# Patient Record
Sex: Male | Born: 1980 | Race: Black or African American | Hispanic: No | Marital: Single | Smoking: Never smoker
Health system: Southern US, Community
[De-identification: ages and names within clinical notes are randomized; demographics above are authoritative.]

## PROBLEM LIST (undated history)

## (undated) DIAGNOSIS — R4701 Aphasia: Secondary | ICD-10-CM

## (undated) DIAGNOSIS — F79 Unspecified intellectual disabilities: Secondary | ICD-10-CM

## (undated) DIAGNOSIS — F419 Anxiety disorder, unspecified: Secondary | ICD-10-CM

## (undated) DIAGNOSIS — F840 Autistic disorder: Secondary | ICD-10-CM

## (undated) DIAGNOSIS — E119 Type 2 diabetes mellitus without complications: Secondary | ICD-10-CM

## (undated) HISTORY — PX: OTHER SURGICAL HISTORY: SHX169

---
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
## 2003-07-12 ENCOUNTER — Encounter: Admission: RE | Admit: 2003-07-12 | Discharge: 2003-07-12 | Payer: Self-pay | Admitting: Family Medicine

## 2005-03-29 ENCOUNTER — Ambulatory Visit: Payer: Self-pay | Admitting: Family Medicine

## 2006-06-25 HISTORY — PX: MULTIPLE TOOTH EXTRACTIONS: SHX2053

## 2006-07-10 ENCOUNTER — Encounter: Admission: RE | Admit: 2006-07-10 | Discharge: 2006-10-08 | Payer: Self-pay | Admitting: Neurology

## 2006-08-22 DIAGNOSIS — E669 Obesity, unspecified: Secondary | ICD-10-CM | POA: Insufficient documentation

## 2006-08-22 DIAGNOSIS — F73 Profound intellectual disabilities: Secondary | ICD-10-CM | POA: Insufficient documentation

## 2007-03-03 ENCOUNTER — Ambulatory Visit (HOSPITAL_COMMUNITY): Admission: RE | Admit: 2007-03-03 | Discharge: 2007-03-03 | Payer: Self-pay | Admitting: Dentistry

## 2007-08-19 ENCOUNTER — Telehealth (INDEPENDENT_AMBULATORY_CARE_PROVIDER_SITE_OTHER): Payer: Self-pay | Admitting: *Deleted

## 2007-12-22 ENCOUNTER — Ambulatory Visit: Payer: Self-pay | Admitting: Family Medicine

## 2007-12-23 ENCOUNTER — Encounter (INDEPENDENT_AMBULATORY_CARE_PROVIDER_SITE_OTHER): Payer: Self-pay | Admitting: Family Medicine

## 2008-01-01 ENCOUNTER — Emergency Department (HOSPITAL_COMMUNITY): Admission: EM | Admit: 2008-01-01 | Discharge: 2008-01-01 | Payer: Self-pay | Admitting: Emergency Medicine

## 2008-01-02 ENCOUNTER — Telehealth: Payer: Self-pay | Admitting: Sports Medicine

## 2008-01-02 ENCOUNTER — Telehealth: Payer: Self-pay | Admitting: Family Medicine

## 2008-01-02 ENCOUNTER — Ambulatory Visit: Payer: Self-pay | Admitting: Family Medicine

## 2008-01-07 ENCOUNTER — Telehealth: Payer: Self-pay | Admitting: *Deleted

## 2008-01-08 ENCOUNTER — Telehealth (INDEPENDENT_AMBULATORY_CARE_PROVIDER_SITE_OTHER): Payer: Self-pay | Admitting: Family Medicine

## 2008-01-09 ENCOUNTER — Encounter (INDEPENDENT_AMBULATORY_CARE_PROVIDER_SITE_OTHER): Payer: Self-pay | Admitting: Family Medicine

## 2008-01-20 ENCOUNTER — Encounter: Payer: Self-pay | Admitting: *Deleted

## 2008-01-26 ENCOUNTER — Ambulatory Visit: Payer: Self-pay | Admitting: Family Medicine

## 2008-01-28 ENCOUNTER — Emergency Department (HOSPITAL_COMMUNITY): Admission: EM | Admit: 2008-01-28 | Discharge: 2008-01-28 | Payer: Self-pay | Admitting: Emergency Medicine

## 2008-02-10 ENCOUNTER — Ambulatory Visit: Payer: Self-pay | Admitting: Family Medicine

## 2008-03-08 ENCOUNTER — Telehealth (INDEPENDENT_AMBULATORY_CARE_PROVIDER_SITE_OTHER): Payer: Self-pay | Admitting: *Deleted

## 2008-04-25 ENCOUNTER — Encounter (INDEPENDENT_AMBULATORY_CARE_PROVIDER_SITE_OTHER): Payer: Self-pay | Admitting: Family Medicine

## 2008-04-25 LAB — CONVERTED CEMR LAB: LDL Cholesterol: 85 mg/dL

## 2008-04-28 ENCOUNTER — Emergency Department (HOSPITAL_COMMUNITY): Admission: EM | Admit: 2008-04-28 | Discharge: 2008-04-29 | Payer: Self-pay | Admitting: Emergency Medicine

## 2008-04-29 ENCOUNTER — Ambulatory Visit: Payer: Self-pay | Admitting: Family Medicine

## 2008-04-29 ENCOUNTER — Telehealth: Payer: Self-pay | Admitting: Family Medicine

## 2008-04-29 DIAGNOSIS — I10 Essential (primary) hypertension: Secondary | ICD-10-CM | POA: Insufficient documentation

## 2008-04-29 DIAGNOSIS — E119 Type 2 diabetes mellitus without complications: Secondary | ICD-10-CM | POA: Insufficient documentation

## 2008-04-30 ENCOUNTER — Telehealth: Payer: Self-pay | Admitting: *Deleted

## 2008-05-07 ENCOUNTER — Telehealth (INDEPENDENT_AMBULATORY_CARE_PROVIDER_SITE_OTHER): Payer: Self-pay | Admitting: Family Medicine

## 2008-05-07 ENCOUNTER — Ambulatory Visit: Payer: Self-pay | Admitting: Family Medicine

## 2008-05-07 ENCOUNTER — Telehealth: Payer: Self-pay | Admitting: *Deleted

## 2008-05-07 ENCOUNTER — Emergency Department (HOSPITAL_COMMUNITY): Admission: EM | Admit: 2008-05-07 | Discharge: 2008-05-08 | Payer: Self-pay | Admitting: Emergency Medicine

## 2008-05-10 ENCOUNTER — Emergency Department (HOSPITAL_COMMUNITY): Admission: EM | Admit: 2008-05-10 | Discharge: 2008-05-10 | Payer: Self-pay | Admitting: Emergency Medicine

## 2008-05-10 ENCOUNTER — Ambulatory Visit: Payer: Self-pay | Admitting: Family Medicine

## 2008-05-10 DIAGNOSIS — L270 Generalized skin eruption due to drugs and medicaments taken internally: Secondary | ICD-10-CM | POA: Insufficient documentation

## 2008-05-12 ENCOUNTER — Ambulatory Visit: Payer: Self-pay | Admitting: Family Medicine

## 2008-05-12 ENCOUNTER — Telehealth: Payer: Self-pay | Admitting: *Deleted

## 2008-05-12 ENCOUNTER — Encounter: Payer: Self-pay | Admitting: Family Medicine

## 2008-05-13 ENCOUNTER — Inpatient Hospital Stay (HOSPITAL_COMMUNITY): Admission: EM | Admit: 2008-05-13 | Discharge: 2008-05-15 | Payer: Self-pay | Admitting: Emergency Medicine

## 2008-05-17 ENCOUNTER — Encounter (INDEPENDENT_AMBULATORY_CARE_PROVIDER_SITE_OTHER): Payer: Self-pay | Admitting: Family Medicine

## 2008-05-17 DIAGNOSIS — R740 Nonspecific elevation of levels of transaminase and lactic acid dehydrogenase [LDH]: Secondary | ICD-10-CM

## 2008-05-17 DIAGNOSIS — E785 Hyperlipidemia, unspecified: Secondary | ICD-10-CM

## 2008-05-19 ENCOUNTER — Ambulatory Visit: Payer: Self-pay | Admitting: Family Medicine

## 2008-05-25 ENCOUNTER — Encounter (INDEPENDENT_AMBULATORY_CARE_PROVIDER_SITE_OTHER): Payer: Self-pay | Admitting: Family Medicine

## 2008-06-14 ENCOUNTER — Encounter (INDEPENDENT_AMBULATORY_CARE_PROVIDER_SITE_OTHER): Payer: Self-pay | Admitting: Family Medicine

## 2008-06-21 ENCOUNTER — Telehealth: Payer: Self-pay | Admitting: *Deleted

## 2008-06-25 ENCOUNTER — Emergency Department (HOSPITAL_COMMUNITY): Admission: EM | Admit: 2008-06-25 | Discharge: 2008-06-26 | Payer: Self-pay | Admitting: Emergency Medicine

## 2008-07-06 ENCOUNTER — Encounter: Admission: RE | Admit: 2008-07-06 | Discharge: 2008-07-06 | Payer: Self-pay | Admitting: Family Medicine

## 2008-07-14 ENCOUNTER — Encounter (INDEPENDENT_AMBULATORY_CARE_PROVIDER_SITE_OTHER): Payer: Self-pay | Admitting: *Deleted

## 2008-07-14 ENCOUNTER — Telehealth: Payer: Self-pay | Admitting: *Deleted

## 2008-12-14 ENCOUNTER — Encounter (INDEPENDENT_AMBULATORY_CARE_PROVIDER_SITE_OTHER): Payer: Self-pay | Admitting: Family Medicine

## 2008-12-20 ENCOUNTER — Telehealth (INDEPENDENT_AMBULATORY_CARE_PROVIDER_SITE_OTHER): Payer: Self-pay | Admitting: *Deleted

## 2009-01-20 ENCOUNTER — Emergency Department (HOSPITAL_COMMUNITY): Admission: EM | Admit: 2009-01-20 | Discharge: 2009-01-20 | Payer: Self-pay | Admitting: Family Medicine

## 2009-02-15 ENCOUNTER — Telehealth: Payer: Self-pay | Admitting: *Deleted

## 2010-07-25 NOTE — Assessment & Plan Note (Signed)
 Summary: bug bite wp   Vital Signs:  Patient Profile:   30 Years Old Male Weight:      260.8 pounds Temp:     98.3 degrees F axillary Pulse rate:   80 / minute  Vitals Entered ByBETHA GREIG LUNGER RN (January 02, 2008 1:35 PM)                 PCP:  Debby Petties, MD  Chief Complaint:  bug bite left arm.  History of Present Illness: The patient is a 30 year old male with autism, Obesity, severe MR, non-verbal who presents with tender swelling on the right forearm for 2 days.  Was first noticed 2 days ago at the patient's care facility by caretaker (present today) They thought it was a bugbite and brought him to [facility].  Was prescribed doxycycline 100mg  by mouth two times a day for 10 days at [facility] and told to follow up with PCP ([facility]) today.  Today the patient was seen and examined, in the standing position as he was uncooperative and would not allow me to examine him on the exam table.  He would also not allow BP or oral temp to be taken.  Axillary temp was obtained however.  The caretaker said that his activity level had not changed, and that he did not have any fatigue, fevers, chills, rigors, cough, SOB, nausea, vomiting, diarrhea, swollen nodes.  ROS as above.     Past Medical History:    Autism    Mental retardation    Obesity    Risk Factors:  Tobacco use:  never    Physical Exam  General:     Well-developed,well-nourished,in no acute distress; alert,appropriate and uncooperative throughout examination Lungs:     Normal respiratory effort,  Lungs are clear to auscultation, no crackles or wheezes. Heart:     Normal rate and regular rhythm. S1 and S2 normal without gallop, murmur, click, rub or other extra sounds. Skin:     4 cm erythematous warm raised lesion noted on medial right forearm with central excoriation.  non-fluctuant.  No evidence of lymphangiitis.  Multiple sites of healed excoriations seen bilaterally on left and right arms. Encircled with red  marker. Axillary Nodes:     None palpable    Impression & Recommendations:  Problem # 1:  CELLULITIS (ICD-682.9) Assessment: New Doubt abcess, no fluctuance, seems to be improving.  Will add mupirocin ointment to augment doxycycline coverage and because lesion excoriated.   Orders: [facility]- Est Level  3 (00786)   Complete Medication List: 1)  Luvox Cr 100 Mg Cp24 (Fluvoxamine maleate) .SABRA.. 1 tab by mouth qam and 2 tab by mouth qhs 2)  Depakote 500 Mg Tbec (Divalproex sodium) .... 4 tab by mouth qhs 3)  Aricept 10 Mg Tabs (Donepezil hcl) .SABRA.. 1 tab by mouth qhs 4)  Amantadine Hcl 100 Mg Caps (Amantadine hcl) .SABRA.. 1 tab by mouth two times a day 5)  Clonazepam 1 Mg Tabs (Clonazepam) .SABRA.. 1 tab by mouth at bedtime 6)  Diazepam 10 Mg Tabs (Diazepam) .... 3 tabs by mouth prior to lab or procedure 7)  Risperdal 3 Mg Tabs (Risperidone) .SABRA.. 1 tab by mouth qhs 8)  Seroquel 25 Mg Tabs (Quetiapine fumarate) .SABRA.. 1 tab by mouth three times a day 9)  Seroquel Xr 300 Mg Tb24 (Quetiapine fumarate) .... 3 tabs by mouth at 8pm 10)  Mupirocin 2 % Oint (Mupirocin) .... Please rub over wound and swelling three times per day.  one small tube.   Patient Instructions: 1)  Please finish prescription for doxycycline.  Return to [facility] in one week.  If patient develops fever or swelling increases past red circle, please take to ER. 2)  Please place mupirocin ointment to swelling 3 times a day.   Prescriptions: MUPIROCIN 2 %  OINT (MUPIROCIN) Please rub over wound and swelling three times per day.  One small tube.  #1 x 0   Entered and Authorized by:   DEBBY PETTIES MD   Signed by:   DEBBY PETTIES MD on 01/02/2008   Method used:   Faxed to ...       [facility]       [address]       [place], [place]  [address]       Ph: [phone]       Fax: [phone]   RxID:   [id number]  ]

## 2010-07-25 NOTE — Progress Notes (Signed)
 [name]

## 2010-11-07 NOTE — Consult Note (Signed)
NAMEARAVIND, CHRISMER             ACCOUNT NO.:  [id number]   MEDICAL RECORD NO.:  [id number]          PATIENT TYPE:  INP   LOCATION:  5148                         FACILITY:  [facility]   PHYSICIAN:  Antonietta Breach, M.D.  DATE OF BIRTH:  07/02/1980   DATE OF CONSULTATION:  05/14/2008  DATE OF DISCHARGE:  05/15/2008                                 CONSULTATION   REFERRING PHYSICIAN:  Dr. Tawanna Cooler McDiarmid.   REASON FOR CONSULTATION:  Agitation, history of psychotropic medication,  optimize current regimen.   HISTORY OF PRESENT ILLNESS:  Mr. [name] is a 30 year old male  admitted to the [facility] on the 18th of November for  dehydration.   Mr. [name] does have a history of autism.  He developed severe  agitation which has been occurring over the past week.  There was a mild  decrease with Ativan p.r.n.  He has developed a severe rash which is  being assessed by dermatology.   The patient's severe agitation has developed while on medications prior  to admission including Risperdal 1 mg daily, Luvox 100 mg q.a.m. and 200  q.h.s., Klonopin 1 mg p.r.n., hydroxyzine p.r.n.   Of note, he has been on a prednisone taper.   PAST PSYCHIATRIC HISTORY:  His mother helped with the history.  Tegretol, trazodone caused a rash.  He has been living at her home some  away from the group home that he usually resides in, and he has been  making holes in her walls with his fists over the last 2 weeks.   He does have a long-term history of combativeness in the presence of  autism.   SOCIAL HISTORY:  Mr. [name] has been living with his mother for the  past 2 weeks, but normally resides in a group home.  He has severe  autism.  He does not use illegal drugs or alcohol.   PAST MEDICAL HISTORY:  1. Diabetes mellitus.  2. Increased LFTs which is associated with stopping his Depakote.   MEDICATIONS:  MAR is reviewed.  He is on:   1. Klonopin 1 mg q.h.s.  2. Luvox 100 mg q.a.m. and 200 mg  q.h.s.  3. Risperdal 1 mg q.h.s.  4. Atarax 10-30 mg q.6 h. p.r.n.  5. He is on prednisone taper.  6. Ativan 1 mg q.4 h. p.r.n.   He has no known drug allergies.   LABORATORY DATA:  WBC 5.9, hemoglobin 19, platelet count 280.  Sodium  133, BUN 11, creatinine 0.77. SGOT 132, SGPT 285.   REVIEW OF SYSTEMS:  NEUROLOGIC:  He has no history of seizures.  The  patient's mother states that she has noted a generalized tremor  recently.  Constitutional, head, eyes, ears, nose, throat, mouth,  psychiatric, cardiovascular, respiratory, gastrointestinal,  genitourinary, skin, musculoskeletal, hematologic, lymphatic, endocrine,  metabolic all unremarkable.   PHYSICAL EXAMINATION:  VITAL SIGNS:  Temperature 97.4, pulse 57,  respiratory rate 18, blood pressure 98/52, O2 saturation on room air  100%.  GENERAL APPEARANCE:  Mr. [name] is a young male who intermittently gets  up and paces, pulling on his IV cable  He does not have any gross  abnormal involuntary movements.   MENTAL STATUS EXAM:  Mr. [name]'s eye contact is intermittent.  He does  maintain an inappropriate smile most of the time.  He does not  demonstrate normal social reciprocity.  His facial expression is  indifferent to the environment.  His attention span is poor.  Concentration is poor.  Mood is agitated, affect agitated.  He appears  to be grossly oriented to himself.  He does respond occasionally to  calling his name.  His memory function is not assessed by formal testing  because he cannot perform formal testing so that fund of knowledge and  abstraction as well as intelligence are grossly below average, but  nonassessable by testing.  His speech is mute.  He does make a few  unintelligible grunts.  Thought process unintelligible grunting.  Thought content:  There is no evidence of hallucinations or delusions at  this time.  He does not appear to be having any self-destructive  impulses.  Also he is redirectable at this  time without combativeness.   Insight is poor, judgment is impaired.   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   Rule out 293.82, psychotic disorder not otherwise specified.  There is a  strong possibility that he is receiving internal stimulation at some of  these episodes.   Axis II:  None.  Axis III:  See past medical history.  Axis IV:  General medical.  Axis V:  30.   Recommendations for psychotropic medication changes were all discussed  with his mother.  She understands and would like to proceed as below.   RECOMMENDATIONS:  1. Would begin Cogentin 1 mg b.i.d. for extrapyramidal symptoms.  2. No change in the above psychotropic medications.  If serial liver      function test assessments observe his transaminases falling to      normal levels, would restart his Depakote at 500 mg sprinkle q.      1800, and then would increase as tolerated by 500 mg every 2 days      to the estimated initial target dose of 1500 mg extended release      q.p.m.  However, if he requires the sprinkles, 500 mg sprinkles      p.o. b.i.d.   Would then recheck his CBC and liver function panel within 3 days and  periodically thereafter.   If his liver function tests do not improve, he will require an increase  of Risperdal for combativeness increasing the Risperdal by 1 mg daily to  the approximate effective dose of 1 mg q.a.m. and 3 mg q.h.s.   He will require periodic abnormal involuntary movement scale as well as  hemoglobin A1c on the Risperdal.   His mother declined a Lamictal trial that is due to a rare risk of rash.  His current rash was obviously associated in her rejection.   Another alternative may be Geodon to replace the Risperdal and starting  at 20 mg b.i.d. while monitoring  EKG QTC.   OUTPATIENT PSYCHIATRIC FOLLOWUP:  Would ask the social worker to arrange  this during the 1st week of discharge.      Antonietta Breach, M.D.  Electronically Signed     JW/MEDQ  D:  05/17/2008  T:  05/17/2008  Job:  [id number]   cc:   Leighton Roach McDiarmid, M.D.

## 2010-11-07 NOTE — Op Note (Signed)
NAME:  [name], [name]             ACCOUNT NO.:  [id number]   MEDICAL RECORD NO.:  [id number]          PATIENT TYPE:  AMB   LOCATION:  SDS                          FACILITY:  [facility]   PHYSICIAN:  H. B. Cobb, D.D.S.     DATE OF BIRTH:  1981/05/26   DATE OF PROCEDURE:  DATE OF DISCHARGE:                               OPERATIVE REPORT   SUBJECTIVE:  The survey consisted of 6 films of good quality.  Trabeculation of the jaw is normal.  Maxillary sinuses are not viewed.  Normal number and alignment of development for a 47 year old child.  Caries is noted in 3 maxillary posterior teeth.  Periodontal structures  were normal and no periapical changes are noted.   IMPRESSION:  Dental caries.   PLAN:  No further recommendations.           ______________________________  Truddie Coco, D.D.S.     HBC/MEDQ  D:  03/03/2007  T:  03/03/2007  Job:  [id number]

## 2010-11-07 NOTE — Discharge Summary (Signed)
[name], [name]             ACCOUNT NO.:  [id number]   MEDICAL RECORD NO.:  [id number]          PATIENT TYPE:  INP   LOCATION:  5148                         FACILITY:  [facility]   PHYSICIAN:  Blane Ohara McDiarmid, M.D.DATE OF BIRTH:  08-11-1980   DATE OF ADMISSION:  05/12/2008  DATE OF DISCHARGE:  05/15/2008                               DISCHARGE SUMMARY   PRIMARY CARE Paislynn Hegstrom:  Dixon Boos, M.D. of [place].   DISCHARGE DIAGNOSES:  1. Diffuse erythematous rash.  2. Dehydration.  3. Diabetes type 2.  4. Mental retardation with severe autism.   CONSULTS:  None.   PROCEDURES:  None.   On the day of admission, for labs, the patient had a white blood cell  count of 5.9, hemoglobin of 19, hematocrit of 56.8, and platelets of  280.  This is likely due to dehydration.  The patient had a repeat CBC  on May 13, 2008 that showed a white blood cell count of 6.2,  hemoglobin of 16.4, and hematocrit 47.8.  He had a complete metabolic  panel that showed slightly low sodium of 133 and a glucose of 204.  Everything else was completely normal.  The urinalysis done on May 13, 2008, was yellow in color.  It was clear in appearance.  He had  greater than 1000 glucose in his urine, but he had negative nitrites,  negative leukocyte, and a completely normal rest of his urinalysis.  He  did have, on the day of discharge, a sodium that had improved to 141 and  a glucose that was still elevated at 161, and otherwise normal basic  metabolic panel.  He had hepatic function tests that were done that  showed a total bilirubin of 1.1, direct bilirubin 0.4, indirect of 0.7,  alk phos of 150, AST of 114, ALT of 329, a total protein of 5.7, and an  albumin of 3.0.  The patient also had a lipid profile that showed a  cholesterol of 149, triglycerides of 129, HDL of 38, LDL of 85, VLDL of  26, and a cholesterol to HDL ratio of 3.9.   BRIEF HOSPITAL COURSE:  This is a  30 year old male with mental  retardation, severe autism, and who is noncommunicative.  He was  accompanied by his mother and caretaker who spoke for him.  He has had  multiple medical problems including a recent diffuse rash that started  around April 28, 2008.  He has had several medications that were  prescribed around that time and were being held at the time of  admission.  He had seen Tereasa Coop, RN in the [facility]  just a few days prior to this admission.  He had been taken off several  medications and they are trying to figure out which medication may have  caused diffuse rash.  He was also seen by Dermatology and was started on  hydroxyzine and triamcinolone cream.  He was also started on a  prednisone course with a taper.  During the hospital course, the rash  seemed to coalesce on his face and  chest into just a solid erythematous  rash across his face and neck and upper back.  His arms, chest, abdomen,  and legs still had a diffusely maculopapular rash that did not seem to  change much during the hospitalization.  The patient did not appear to  scratch or have itching on the lesions.  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  The patient was kept in the hospital for several  days, hydrated, and treated with prednisone, hydroxyzine,  and  triamcinolone cream.  The patient did well during his hospital stay.  The rash was beginning to subside towards the end of the hospitalization  and it was felt that the patient could return back to his group home.  He did have a consult done by Dr. Felizardo Hoffmann on May 14, 2008,  where he recommended beginning Cogentin 1 mg b.i.d. for extrapyramidal  symptoms.  He did suggest restarting his Depakote at 500 mg sprinkle  q.1800 and would increase as tolerated by 500 mg every 2 days to  estimate the initial target dose of 1500 mg extended release  q.p.m.  However, if he required the sprinkle, 500 mg of sprinkle would be given  p.o. b.i.d.  This was on contingent that his serial liver function tests  observed his transaminases falling to normal levels.  If the liver  functions did not improve, he would require an increase in the Risperdal  for combativeness, increasing the Risperdal by 1 mg daily to the  approximate effective dose of 1 mg q.a.m. and 3 mg at bedtime  Dr.  Rhona Raider thought that he would require periodic abnormal involuntary  movement scale as well as hemoglobin A1c on the Risperdal.  His mother  declined the Lamictal trial due to its rare risk of rash.  His current  rash was obviously associated with her rejection of the medication.  Another alternative could be Geodon to replace the Risperdal and he  could start the Geodon at 20 mg b.i.d. while monitoring for EKG of QT  interval.   RECOMMENDATIONS:  1. It was recommended that the patient have outpatient psychiatry      followup during the first week after discharge.  2. Dehydration.  The patient came in with some dehydration.  He was      given approximately 5 liters of fluids during his hospital stay.      His dehydration had resolved.  3. Diabetes type 2.  This is a fairly new diagnosis.  The patient was      continued on glipizide.  The patient has agitation with needle      sticks and would likely not tolerate Lantus.  There is no plan to      check CBGs during the hospital stay or immediately after the      hospital stay due to elevated blood glucose levels, expected      because the patient was on steroids.  Sliding scale also was not      considered in this patient because of his severe agitation with      needle sticks.  4. Mental retardation and severe autism and noncommunicative state.      The patient becomes aggressive and frustrated when he does not      understand something.  The patient was on clonazepam, risperidone,      fluvoxamine.  Plan to  change to Geodon in the future according to  Dr. Mila Homer who is his followup for psych meds at his group home.      There is also the recommendations from Dr. Jeanie Sewer that must be      considered by his PCP.  He had a followup appointment with Dr. Sylvan Cheese on Tuesday, May 18, 2008 at 11:25 a.m.   DISCHARGE CONDITION:  The patient was discharged back to his group home  in stable medical condition.      Jamie Brookes, MD  Electronically Signed      Leighton Roach McDiarmid, M.D.  Electronically Signed    AS/MEDQ  D:  05/20/2008  T:  05/20/2008  Job:  [id number]

## 2010-11-07 NOTE — Op Note (Signed)
NAME:  [name], [name]             ACCOUNT NO.:  [id number]   MEDICAL RECORD NO.:  [id number]          PATIENT TYPE:  AMB   LOCATION:  SDS                          FACILITY:  [facility]   PHYSICIAN:  H. B. Cobb, D.D.S.     DATE OF BIRTH:  [birth year]   DATE OF PROCEDURE:  DATE OF DISCHARGE:                               OPERATIVE REPORT   PREOPERATIVE DIAGNOSES:  1. Acute stress reaction.  2. Multiple dental caries.  3. Autism.   POSTOPERATIVE DIAGNOSES:  1. Acute stress reaction.  2. Multiple dental caries.  3. Autism.   PROCEDURE:  Following establishment of anesthesia, the head and airways  were stabilized and 6 dental x-rays were exposed.  The face was scrubbed  with a Betadine solution and a moist vaginal throat pack was placed.  The teeth were thoroughly cleansed with a prophylaxis paste and decay  was charted.  Following prophylaxis, gross and fine scale was completed  and curettage was completed on the maxillary left and mandibular left  quadrants.  The following procedure was performed once curettage was  completed.  Tooth number 6 facial composite resin, tooth number 11  fascial composite resin, tooth number 22 facial composite resin, tooth  number 27 fascial composite resin, tooth number 5 MODFL composite resin,  tooth number 13 MODFL composite resin, and tooth number 12 MODFL  composite resin.  Following completion of restorations, the mouth was  cleansed of all debris.  The throat pack was removed.  The patient was  extubated and taken to recovery room in fair condition.           ______________________________  Truddie Coco, D.D.S.     HBC/MEDQ  D:  03/03/2007  T:  03/03/2007  Job:  [id number]

## 2010-11-07 NOTE — H&P (Signed)
[name], [name]             ACCOUNT NO.:  [id number]   MEDICAL RECORD NO.:  [id number]          PATIENT TYPE:  INP   LOCATION:  5148                         FACILITY:  [facility]   PHYSICIAN:  Leighton Roach McDiarmid, M.D.DATE OF BIRTH:  Jun 11, 1981   DATE OF ADMISSION:  05/12/2008  DATE OF DISCHARGE:                              HISTORY & PHYSICAL   PRIMARY CARE Laverta Harnisch:  Sylvan Cheese, MD   CHIEF COMPLAINT:  Dehydration and rash.   HISTORY OF PRESENT ILLNESS:  This is a 30 year old male who was recently  seen in the clinic by Rosalio Macadamia.  His mother and caretaker spoke  before him because he is severely autistic and has mental retardation.  He has some decreased p.o. intake for the last 2 weeks and has lost  about 14 pounds in the last 2 weeks.  He has had some dark urine and  decreased urination.  They have some concerns over rash also that he has  had for 1 month.  He has been seen few times for this rash in our clinic  as well and seen few days ago by Rosalio Macadamia in our clinic and the same  day was seen by Dermatology.  He was instructed to stop several  medications; Equetro, aspirin, lisinopril, metformin, and trazodone and  was instructed to take hydroxyzine 10 mg 1-3 tablets p.o. q.6 h. as  needed for itching and was also to use triamcinolone 0.1% cream and was  started on a prednisone course with the taper.   On review of systems, he has no shortness of breath, cough, or wheezing.  He is afebrile at the time of admission.   PAST MEDICAL HISTORY:  1. Significant for autism.  2. Mental retardation.  3. Obesity.  4. Diabetes.   PAST SURGICAL HISTORY:  None, but he does have to be sedated for dental  procedures.   FAMILY HISTORY:  He had a dad that died from colon cancer.  Mom who is  in her 6s and two brothers; another brother that has autism.   SOCIAL HISTORY:  The patient lives in a group home.  He has a brother as  a resident in this group home as well.  His mother  is still involved  with his care.  No alcohol, drugs, or tobacco.   REVIEW OF SYSTEMS:  See HPI.   PHYSICAL EXAM:  VITAL SIGNS:  His vital signs are 96.4 degrees  temperature.  His pulse is 128, respiratory 22, blood pressure was 98/70  and he had oxygen saturation of 96%.  GENERAL:  He did not communicate.  He has autism.  HEENT:  His head showed a diffuse rash with erythema on his face and  head.  The patient's eyes were cat-closed during the exam.  He was  erythematous.  No nasal discharge.  He has his mouth hanging up in at  all time.  He has poor dentition, but his chest wall has a rash on it.  LUNGS:  Clear to auscultation bilaterally with normal respiratory  effort.  He had normal rate and rhythm.  S1 and S2 were  normal without  gallops, rubs, murmurs, clicks, or any other extra sounds.  ABDOMEN:  Rash with soft and normal bowel sounds.  No distention.  No  masses and no guarding.  EXTREMITIES:  Rash, maculopapular diffusely all over the body.  NEUROLOGIC:  The patient has mental retardation and autism.  He does not  communicate.   His home medications include,  1. Luvox 100 mg 1 tablet p.o. q.a.m. and 2 tablets p.o. at bedtime.  2. He also takes prednisone 20 mg 3 tablets q.a.m. for 3 days, then      dropped down to 2 tablets for 2 days.  3. He was also on Risperdal 1 mg at bedtime.  4. Klonopin 1 mg at bedtime.  5. Triamcinolone cream 0.1% apply to the affected area.  6. Hydroxyzine 10 mg 1-3 tablets p.o. q.6 h. p.r.n. itching.   His labs at the time of admission, he had a white blood cell count of  5.9, hemoglobin of 19, hematocrit of 56.8, platelets of 286, neutrophils  were 42, monocytes were 17, and eosinophils were 12.  He had a Chem-8 i-  STAT that showed a sodium of 129, potassium 4.2, chloride 96, bicarb 26,  BUN 16, and creatinine of 1.1.   ASSESSMENT AND PLAN:  This is a 30 year old male with mental retardation  and autism that came to the ED with dehydration  and rash.  1. Dehydration.  The patient has not been taking oral foods and      liquids.  Well lately, he has had some weight loss.  The patient is      likely dehydrated.  The plan was to give the patient 1 liter bolus      of normal saline and then increase the fluid volume, a total fluid      volume with normal saline at 200 milliliters per hour.  I plan to      do a urinalysis and micro now and recheck a CBC and CMET in the      morning.  He will be encouraged to take food and drinks by mouth.      He has a carb modified diet.  2. Rash.  The patient will be continued on his home medications of      prednisone and with his taper, the patient will continue      triamcinolone cream and hydroxyzine by mouth for itching.  3. The patient had mental retardation and autism.  He was sedated to      have blood draws and CBGs done.  He takes Risperdal, Luvox,      clonazepam, and Ativan for his behavior issues.  At the time of      admission, the Risperdal and Ativan were held only given for acute      needs or blood draws.  4. Diabetes type 2.  This is a fairly new diagnosis for this patient.      He is going to be covered with a moderate sliding scale and have no      basal insulin at this time as the patient cannot tolerate needle      sticks.  5. Fluids, electrolytes, nutrition/gastrointestinal.  The patient had      carb modified diet written form.  He has fluids running at a 200 mL      per hour.  6. Prophylaxis.  The patient will be started on Protonix.  He will not      be able to  tolerate the heparin as it is an injection or the      Lovenox that is an injection as well and then the patient becomes      very agitated and can at the time become violent with his      agitation.   DISPOSITION:  Plan to discharge the patient once he is eating and  drinking better and we have him on a good medication regimen.     Jamie Brookes, MD  Electronically Signed      Leighton Roach McDiarmid,  M.D.  Electronically Signed   AS/MEDQ  D:  05/14/2008  T:  05/15/2008  Job:  [id number]

## 2011-01-19 ENCOUNTER — Emergency Department (HOSPITAL_COMMUNITY)
Admission: EM | Admit: 2011-01-19 | Discharge: 2011-01-19 | Disposition: A | Discharge status: Home / Self Care | Payer: Medicare Other | Attending: Emergency Medicine | Admitting: Emergency Medicine

## 2011-01-19 DIAGNOSIS — F840 Autistic disorder: Secondary | ICD-10-CM | POA: Insufficient documentation

## 2011-01-19 DIAGNOSIS — F79 Unspecified intellectual disabilities: Secondary | ICD-10-CM | POA: Insufficient documentation

## 2011-01-19 DIAGNOSIS — E119 Type 2 diabetes mellitus without complications: Secondary | ICD-10-CM | POA: Insufficient documentation

## 2011-01-19 DIAGNOSIS — Z79899 Other long term (current) drug therapy: Secondary | ICD-10-CM | POA: Insufficient documentation

## 2011-03-27 LAB — URINALYSIS, ROUTINE W REFLEX MICROSCOPIC
Leukocytes, UA: NEGATIVE
Protein, ur: NEGATIVE
Urobilinogen, UA: 0.2

## 2011-03-27 LAB — HEPATIC FUNCTION PANEL
ALT: 329 — ABNORMAL HIGH
AST: 114 — ABNORMAL HIGH
Alkaline Phosphatase: 150 — ABNORMAL HIGH
Indirect Bilirubin: 0.7
Total Protein: 5.7 — ABNORMAL LOW

## 2011-03-27 LAB — DIFFERENTIAL
Band Neutrophils: 0
Basophils Absolute: 0
Basophils Absolute: 0
Basophils Relative: 0
Basophils Relative: 1
Eosinophils Relative: 12 — ABNORMAL HIGH
Lymphocytes Relative: 28
Lymphocytes Relative: 40
Lymphs Abs: 1.6
Metamyelocytes Relative: 0
Monocytes Absolute: 1
Myelocytes: 0
Promyelocytes Absolute: 0

## 2011-03-27 LAB — LIPID PANEL
Cholesterol: 149
LDL Cholesterol: 85
VLDL: 26

## 2011-03-27 LAB — URINALYSIS, MICROSCOPIC ONLY
Leukocytes, UA: NEGATIVE
Nitrite: NEGATIVE
Specific Gravity, Urine: 1.027
Urobilinogen, UA: 1
pH: 6.5

## 2011-03-27 LAB — BASIC METABOLIC PANEL
BUN: 7
Chloride: 106
GFR calc non Af Amer: >60
Glucose, Bld: 161 — ABNORMAL HIGH
Potassium: 4.6
Sodium: 141

## 2011-03-27 LAB — VALPROIC ACID LEVEL: Valproic Acid Lvl: 64.2

## 2011-03-27 LAB — CBC
HCT: 56.8 — ABNORMAL HIGH
Hemoglobin: 19 — ABNORMAL HIGH
MCHC: 33.4
MCV: 86.2
Platelets: 151
Platelets: 266
RDW: 12.8
RDW: 13
RDW: 13.2
WBC: 4
WBC: 6.2

## 2011-03-27 LAB — POCT I-STAT, CHEM 8
BUN: 16
Calcium, Ion: 1.08 — ABNORMAL LOW
TCO2: 26

## 2011-03-27 LAB — COMPREHENSIVE METABOLIC PANEL
ALT: 35
AST: 132 — ABNORMAL HIGH
AST: 31
Albumin: 2.8 — ABNORMAL LOW
Albumin: 3.3 — ABNORMAL LOW
Alkaline Phosphatase: 96
Calcium: 7.9 — ABNORMAL LOW
Chloride: 102
Chloride: 99
Creatinine, Ser: 0.77
GFR calc Af Amer: >60
GFR calc Af Amer: >60
Potassium: 4.1
Sodium: 132 — ABNORMAL LOW
Total Bilirubin: 0.7
Total Protein: 5.1 — ABNORMAL LOW
Total Protein: 6.3

## 2011-03-27 LAB — CARBAMAZEPINE LEVEL, TOTAL: Carbamazepine Lvl: 11.8

## 2011-03-27 LAB — GLUCOSE, CAPILLARY: Glucose-Capillary: 335 — ABNORMAL HIGH

## 2011-03-27 LAB — URINE MICROSCOPIC-ADD ON

## 2014-11-18 ENCOUNTER — Encounter (HOSPITAL_COMMUNITY): Payer: Self-pay | Admitting: *Deleted

## 2014-11-18 MED ORDER — CEFAZOLIN SODIUM-DEXTROSE 2-3 GM-% IV SOLR
2.0000 g | INTRAVENOUS | Status: AC
  Administered 2014-11-19: 2 g via INTRAVENOUS

## 2014-11-18 MED ORDER — CEFAZOLIN SODIUM-DEXTROSE 2-3 GM-% IV SOLR: 2.0000 g | INTRAVENOUS | Status: DC

## 2014-11-18 NOTE — Progress Notes (Signed)
[facility]  Patients mother will be here for surgery and will sign consent.  Mother said they have not been able to collect CBG lately.

## 2014-11-18 NOTE — H&P (Signed)
HISTORY AND PHYSICAL  [name] is a 34 y.o. male patient referred by pediatric dentist for dental extractions. Profound mental retardation. Uncooperative and combative  No diagnosis found.  No past medical history on file.  No current facility-administered medications for this encounter.   Current Outpatient Prescriptions  Medication Sig Dispense Refill  . benztropine (COGENTIN) 1 MG tablet Take 1 mg by mouth 2 (two) times daily.      . clonazePAM (KLONOPIN) 2 MG tablet Take 3 mg by mouth at bedtime.    . fluvoxaMINE (LUVOX) 100 MG tablet Take 1 tab by mouth every morning and 2 tabs by mouth at bedtime.    Marland Kitchen. glipiZIDE (GLUCOTROL XL) 5 MG 24 hr tablet Take 5 mg by mouth daily with breakfast.    . risperiDONE (RISPERDAL M-TABS) 2 MG disintegrating tablet Take 2 mg by mouth daily as needed.    . risperiDONE (RISPERDAL) 2 MG tablet Take 2 mg by mouth 2 (two) times daily.     . traZODone (DESYREL) 50 MG tablet Take 50 mg by mouth at bedtime.     Allergies  Allergen Reactions  . Carbamazepine    Active Problems:   * No active hospital problems. *  Vitals: There were no vitals taken for this visit. Lab results:No results found for this or any previous visit (from the past 24 hour(s)). Radiology Results: No results found. General appearance: combative and uncooperative Head: Normocephalic, without obvious abnormality, atraumatic Eyes: grossly normal Nose: grossly normal Throat: unable to examine- patient uncooperative Resp: unable to examine Cardio: unable to examine  Assessment: 33 BM severely mentally retarded with decayed teeth    Plan:EUA. Exractions as needed. General anesthesia. Day surgery.   Georgia LopesJENSEN,Royce Sciara M 11/18/2014

## 2014-11-19 ENCOUNTER — Ambulatory Visit (HOSPITAL_COMMUNITY)
Admission: RE | Admit: 2014-11-19 | Discharge: 2014-11-19 | Disposition: A | Discharge status: Home / Self Care | Payer: Medicare Other | Source: Ambulatory Visit | Attending: Oral Surgery | Admitting: Oral Surgery

## 2014-11-19 ENCOUNTER — Encounter (HOSPITAL_COMMUNITY): Payer: Self-pay | Admitting: *Deleted

## 2014-11-19 ENCOUNTER — Encounter (HOSPITAL_COMMUNITY)
Admission: RE | Disposition: A | Discharge status: Home / Self Care | Payer: Self-pay | Source: Ambulatory Visit | Attending: Oral Surgery

## 2014-11-19 ENCOUNTER — Ambulatory Visit (HOSPITAL_COMMUNITY): Payer: Medicare Other | Admitting: Certified Registered"

## 2014-11-19 ENCOUNTER — Ambulatory Visit (HOSPITAL_COMMUNITY): Payer: Medicare Other

## 2014-11-19 DIAGNOSIS — E119 Type 2 diabetes mellitus without complications: Secondary | ICD-10-CM | POA: Insufficient documentation

## 2014-11-19 DIAGNOSIS — F419 Anxiety disorder, unspecified: Secondary | ICD-10-CM | POA: Insufficient documentation

## 2014-11-19 DIAGNOSIS — Z419 Encounter for procedure for purposes other than remedying health state, unspecified: Secondary | ICD-10-CM

## 2014-11-19 DIAGNOSIS — F72 Severe intellectual disabilities: Secondary | ICD-10-CM | POA: Diagnosis not present

## 2014-11-19 DIAGNOSIS — Z6835 Body mass index (BMI) 35.0-35.9, adult: Secondary | ICD-10-CM | POA: Insufficient documentation

## 2014-11-19 DIAGNOSIS — I10 Essential (primary) hypertension: Secondary | ICD-10-CM | POA: Insufficient documentation

## 2014-11-19 DIAGNOSIS — Z888 Allergy status to other drugs, medicaments and biological substances status: Secondary | ICD-10-CM | POA: Insufficient documentation

## 2014-11-19 DIAGNOSIS — K029 Dental caries, unspecified: Secondary | ICD-10-CM | POA: Diagnosis present

## 2014-11-19 HISTORY — DX: Anxiety disorder, unspecified: F41.9

## 2014-11-19 HISTORY — DX: Unspecified intellectual disabilities: F79

## 2014-11-19 HISTORY — DX: Type 2 diabetes mellitus without complications: E11.9

## 2014-11-19 HISTORY — PX: DENTAL RESTORATION/EXTRACTION WITH X-RAY: SHX5796

## 2014-11-19 LAB — COMPREHENSIVE METABOLIC PANEL
ALT: 26 U/L (ref 17–63)
AST: 30 U/L (ref 15–41)
Albumin: 3.9 g/dL (ref 3.5–5.0)
Alkaline Phosphatase: 85 U/L (ref 38–126)
Anion gap: 9 (ref 5–15)
BUN: 10 mg/dL (ref 6–20)
CALCIUM: 9.2 mg/dL (ref 8.9–10.3)
CO2: 27 mmol/L (ref 22–32)
CREATININE: 0.99 mg/dL (ref 0.61–1.24)
Chloride: 102 mmol/L (ref 101–111)
GFR calc Af Amer: >60 mL/min (ref >60)
GFR calc non Af Amer: >60 mL/min (ref >60)
Glucose, Bld: 86 mg/dL (ref 65–99)
POTASSIUM: 4.2 mmol/L (ref 3.5–5.1)
Sodium: 138 mmol/L (ref 135–145)
Total Bilirubin: 0.9 mg/dL (ref 0.3–1.2)
Total Protein: 7.1 g/dL (ref 6.5–8.1)

## 2014-11-19 LAB — CBC
HCT: 47.1 % (ref 39.0–52.0)
Hemoglobin: 17.1 g/dL — ABNORMAL HIGH (ref 13.0–17.0)
MCH: 28.6 pg (ref 26.0–34.0)
MCHC: 36.3 g/dL — AB (ref 30.0–36.0)
MCV: 78.9 fL (ref 78.0–100.0)
Platelets: 235 10*3/uL (ref 150–400)
RBC: 5.97 MIL/uL — ABNORMAL HIGH (ref 4.22–5.81)
RDW: 14 % (ref 11.5–15.5)
WBC: 7.7 10*3/uL (ref 4.0–10.5)

## 2014-11-19 LAB — LIPID PANEL
CHOL/HDL RATIO: 3.1 ratio
Cholesterol: 119 mg/dL (ref 0–200)
HDL: 39 mg/dL — AB (ref >40)
LDL Cholesterol: 70 mg/dL (ref 0–99)
TRIGLYCERIDES: 52 mg/dL (ref <150)
VLDL: 10 mg/dL (ref 0–40)

## 2014-11-19 LAB — TSH: TSH: 4.835 u[IU]/mL — ABNORMAL HIGH (ref 0.350–4.500)

## 2014-11-19 LAB — GLUCOSE, CAPILLARY: GLUCOSE-CAPILLARY: 112 mg/dL — AB (ref 65–99)

## 2014-11-19 SURGERY — DENTAL RESTORATION/EXTRACTION WITH X-RAY
Anesthesia: General | Site: Mouth

## 2014-11-19 MED ORDER — KETAMINE HCL 100 MG/ML IJ SOLN
INTRAMUSCULAR | Status: AC
  Filled 2014-11-19: qty 1

## 2014-11-19 MED ORDER — ONDANSETRON HCL 4 MG/2ML IJ SOLN
INTRAMUSCULAR | Status: DC | PRN
Start: 2014-11-19 — End: 2014-11-19
  Administered 2014-11-19: 4 mg via INTRAVENOUS

## 2014-11-19 MED ORDER — PROPOFOL 10 MG/ML IV BOLUS
INTRAVENOUS | Status: DC | PRN
  Administered 2014-11-19: 40 mg via INTRAVENOUS

## 2014-11-19 MED ORDER — SODIUM CHLORIDE 0.9 % IR SOLN
Status: DC | PRN
  Administered 2014-11-19: 100 mL

## 2014-11-19 MED ORDER — OXYMETAZOLINE HCL 0.05 % NA SOLN
NASAL | Status: AC
  Filled 2014-11-19: qty 15

## 2014-11-19 MED ORDER — LORAZEPAM 0.5 MG PO TABS
ORAL_TABLET | ORAL | Status: AC
  Filled 2014-11-19: qty 4

## 2014-11-19 MED ORDER — PROPOFOL 10 MG/ML IV BOLUS
INTRAVENOUS | Status: AC
  Filled 2014-11-19: qty 20

## 2014-11-19 MED ORDER — LIDOCAINE-EPINEPHRINE 1 %-1:100000 IJ SOLN
INTRAMUSCULAR | Status: AC
  Filled 2014-11-19: qty 1

## 2014-11-19 MED ORDER — HYDROCODONE-ACETAMINOPHEN 5-325 MG PO TABS: 1.0000 | ORAL_TABLET | Freq: Four times a day (QID) | ORAL | Status: DC | PRN

## 2014-11-19 MED ORDER — LIDOCAINE-EPINEPHRINE 2 %-1:100000 IJ SOLN
INTRAMUSCULAR | Status: DC | PRN
  Administered 2014-11-19: 14 mL

## 2014-11-19 MED ORDER — AMOXICILLIN 500 MG PO CAPS: 500.0000 mg | ORAL_CAPSULE | Freq: Three times a day (TID) | ORAL | Status: DC

## 2014-11-19 MED ORDER — PHENYLEPHRINE HCL 10 MG/ML IJ SOLN
INTRAMUSCULAR | Status: DC | PRN
  Administered 2014-11-19 (×2): 80 ug via INTRAVENOUS

## 2014-11-19 MED ORDER — KETAMINE HCL 100 MG/ML IJ SOLN
INTRAMUSCULAR | Status: DC | PRN
  Administered 2014-11-19: 400 mg via INTRAMUSCULAR

## 2014-11-19 MED ORDER — FENTANYL CITRATE (PF) 100 MCG/2ML IJ SOLN
INTRAMUSCULAR | Status: DC | PRN
  Administered 2014-11-19 (×2): 50 ug via INTRAVENOUS

## 2014-11-19 MED ORDER — LACTATED RINGERS IV SOLN
INTRAVENOUS | Status: DC | PRN
  Administered 2014-11-19: 08:00:00 via INTRAVENOUS

## 2014-11-19 MED ORDER — FENTANYL CITRATE (PF) 100 MCG/2ML IJ SOLN: 25.0000 ug | INTRAMUSCULAR | Status: DC | PRN

## 2014-11-19 MED ORDER — LIDOCAINE-EPINEPHRINE 2 %-1:100000 IJ SOLN
INTRAMUSCULAR | Status: AC
  Filled 2014-11-19: qty 1

## 2014-11-19 MED ORDER — PROMETHAZINE HCL 25 MG/ML IJ SOLN: 6.2500 mg | INTRAMUSCULAR | Status: DC | PRN

## 2014-11-19 MED ORDER — 0.9 % SODIUM CHLORIDE (POUR BTL) OPTIME
TOPICAL | Status: DC | PRN
  Administered 2014-11-19: 1000 mL

## 2014-11-19 MED ORDER — LIDOCAINE HCL (CARDIAC) 20 MG/ML IV SOLN
INTRAVENOUS | Status: DC | PRN
  Administered 2014-11-19: 60 mg via INTRAVENOUS

## 2014-11-19 MED ORDER — FENTANYL CITRATE (PF) 250 MCG/5ML IJ SOLN
INTRAMUSCULAR | Status: AC
  Filled 2014-11-19: qty 5

## 2014-11-19 MED ORDER — LORAZEPAM 2 MG PO TABS
2.0000 mg | ORAL_TABLET | Freq: Once | ORAL | Status: AC
  Administered 2014-11-19: 2 mg via ORAL
  Filled 2014-11-19: qty 1

## 2014-11-19 MED ORDER — SUCCINYLCHOLINE CHLORIDE 20 MG/ML IJ SOLN
INTRAMUSCULAR | Status: DC | PRN
  Administered 2014-11-19: 100 mg via INTRAVENOUS

## 2014-11-19 SURGICAL SUPPLY — 27 items
BUR CROSS CUT FISSURE 1.6 (BURR) IMPLANT
BUR CROSS CUT FISSURE 1.6MM (BURR)
BUR EGG ELITE 4.0 (BURR) ×2 IMPLANT
BUR EGG ELITE 4.0MM (BURR) ×1
CANISTER SUCTION 2500CC (MISCELLANEOUS) ×3 IMPLANT
COVER SURGICAL LIGHT HANDLE (MISCELLANEOUS) ×3 IMPLANT
GAUZE PACKING FOLDED 2  STR (GAUZE/BANDAGES/DRESSINGS) ×2
GAUZE PACKING FOLDED 2 STR (GAUZE/BANDAGES/DRESSINGS) ×1 IMPLANT
GLOVE BIO SURGEON STRL SZ7.5 (GLOVE) ×3 IMPLANT
GLOVE BIOGEL PI IND STRL 8 (GLOVE) ×1 IMPLANT
GLOVE BIOGEL PI INDICATOR 8 (GLOVE) ×2
GLOVE SURG SS PI 8.0 STRL IVOR (GLOVE) ×3 IMPLANT
GOWN STRL REUS W/ TWL LRG LVL3 (GOWN DISPOSABLE) IMPLANT
GOWN STRL REUS W/ TWL XL LVL3 (GOWN DISPOSABLE) ×2 IMPLANT
GOWN STRL REUS W/TWL LRG LVL3 (GOWN DISPOSABLE)
GOWN STRL REUS W/TWL XL LVL3 (GOWN DISPOSABLE) ×4
KIT BASIN OR (CUSTOM PROCEDURE TRAY) ×3 IMPLANT
KIT ROOM TURNOVER OR (KITS) ×3 IMPLANT
NEEDLE 22X1 1/2 (OR ONLY) (NEEDLE) ×3 IMPLANT
NS IRRIG 1000ML POUR BTL (IV SOLUTION) ×3 IMPLANT
PAD ARMBOARD 7.5X6 YLW CONV (MISCELLANEOUS) ×6 IMPLANT
SPONGE SURGIFOAM ABS GEL 12-7 (HEMOSTASIS) IMPLANT
SUT CHROMIC 3 0 PS 2 (SUTURE) IMPLANT
TOWEL OR 17X24 6PK STRL BLUE (TOWEL DISPOSABLE) ×3 IMPLANT
TRAY ENT MC OR (CUSTOM PROCEDURE TRAY) ×3 IMPLANT
TUBING IRRIGATION (MISCELLANEOUS) ×3 IMPLANT
YANKAUER SUCT BULB TIP NO VENT (SUCTIONS) ×3 IMPLANT

## 2014-11-19 NOTE — Anesthesia Preprocedure Evaluation (Addendum)
Anesthesia Evaluation  Patient identified by MRN, date of birth, ID band Patient awake  General Assessment Comment:Patient c MR , uncooperative, patient will not allow any examination blood draws or iv  Airway Mallampati: III   Neck ROM: Limited  Mouth opening: Limited Mouth Opening  Dental  (+) Poor Dentition   Pulmonary neg pulmonary ROS,  breath sounds clear to auscultation        Cardiovascular hypertension, Rhythm:Regular Rate:Normal     Neuro/Psych Anxiety    GI/Hepatic   Endo/Other  diabetesMorbid obesity  Renal/GU      Musculoskeletal   Abdominal (+) + obese,   Peds  Hematology   Anesthesia Other Findings   Reproductive/Obstetrics                            Anesthesia Physical Anesthesia Plan  ASA: III  Anesthesia Plan: General   Post-op Pain Management:    Induction: Intravenous  Airway Management Planned: Oral ETT  Additional Equipment:   Intra-op Plan:   Post-operative Plan: Extubation in OR  Informed Consent:   Plan Discussed with:   Anesthesia Plan Comments: (Plan IM ketamine)        Anesthesia Quick Evaluation

## 2014-11-19 NOTE — Anesthesia Postprocedure Evaluation (Signed)
  Anesthesia Post-op Note  Patient: [name]  Procedure(s) Performed: Procedure(s): Exam under anesthesia, dental extractions #10 and #15 (N/A)  Patient Location: PACU  Anesthesia Type:General  Level of Consciousness: awake, sedated and responds to stimulation  Airway and Oxygen Therapy: Patient Spontanous Breathing  Post-op Pain: none  Post-op Assessment: Post-op Vital signs reviewed  Post-op Vital Signs: stable  Last Vitals:  Filed Vitals:   11/19/14 0952  Temp: 36.3 C    Complications: No apparent anesthesia complications

## 2014-11-19 NOTE — Op Note (Signed)
[name], [name]             ACCOUNT NO.:  [id number]  MEDICAL RECORD NO.:  [id number]  LOCATION:  [facility]                         FACILITY:  [facility]  PHYSICIAN:  Georgia LopesScott M. Awesome Jared, M.D.  DATE OF BIRTH:  [birth year]  DATE OF PROCEDURE:  11/19/2014 DATE OF DISCHARGE:                              OPERATIVE REPORT   PREOPERATIVE DIAGNOSES:  Severe mental retardation, nonrestorable teeth.  POSTOPERATIVE DIAGNOSES:  Nonrestorable teeth numbers 10 and 15.  PROCEDURE:  Examination under anesthesia; dental extraction, teeth numbers 10 and 15.  SURGEON:  Georgia LopesScott M. Velencia Lenart, M.D.  ESTIMATED BLOOD LOSS:  Minimum.  COMPLICATIONS:  None.  SPECIMENS:  None.  PROCEDURE IN DETAIL:  The patient was taken to the operating room. General anesthesia was administered intravenously, and an oral endotracheal tube was placed.  The oral cavity was examined and found to have no masses or lesions.  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  Tooth #10 had a stable root, but there was inadequate enamel remaining for restoration.  These teeth were deemed to be nonrestorable.  There was a fracture over distal occlusal amalgam filling on tooth #20.  The distal portion was no longer present.  There was no recurrent decay noted. Then, x-ray took oblique radiographs of the right and left mandible, modified Towne, [place] view.  There was at first some concern that there could be a lesion of the left angle of the mandible; but correlating with physical examination, this was felt to be normal outflaring of the inferior margin at the mandibular angle, and no discrete masses were noted.  Then, the patient was draped for the procedure.  A time-out was performed.  The posterior pharynx was suctioned.  A throat pack was placed.  A 2% lidocaine with 1:100,000 epinephrine was infiltrated buccally and palatally around teeth numbers 10 and 15.  The  periosteum was incised with the 15 blade in a sulcular fashion around his two teeth; and then, the periosteum was reflected with the periosteal elevator.  The teeths were elevated with a dental elevator and removed from the mouth with a dental forceps.  The teeth were removed intact with no fractures.  The sockets were curetted and irrigated; and then, additional lidocaine 2% with 1:100,000 epinephrine was infiltrated, total of 15 mL was utilized in these areas to provide long-lasting anesthesia.  Then, the oral cavity was irrigated, suctioned.  The throat pack was removed.  The patient was awakened, taken to the recovery room breathing spontaneously in good condition.     Georgia LopesScott M. Krishna Dancel, M.D.     SMJ/MEDQ  D:  11/19/2014  T:  11/19/2014  Job:  [id number]

## 2014-11-19 NOTE — Transfer of Care (Signed)
Immediate Anesthesia Transfer of Care Note  Patient: [name]  Procedure(s) Performed: Procedure(s): Exam under anesthesia, dental extractions #10 and #15 (N/A)  Patient Location: PACU  Anesthesia Type:General  Level of Consciousness: sedated, pateint uncooperative and responds to stimulation  Airway & Oxygen Therapy: Patient connected to face mask oxygen  Post-op Assessment: Report given to RN, Post -op Vital signs reviewed and stable and Patient moving all extremities X 4  Post vital signs: stable  Last Vitals: There were no vitals filed for this visit.  Complications: No apparent anesthesia complications

## 2014-11-19 NOTE — H&P (Signed)
H&P documentation  -History and Physical Reviewed  -Patient has been re-examined  -No change in the plan of care  [name]  

## 2014-11-19 NOTE — Addendum Note (Signed)
Addendum  created 11/19/14 1259 by Lanell MatarKathryn M Jalin Erpelding, CRNA   Modules edited: Anesthesia Medication Administration

## 2014-11-19 NOTE — Op Note (Signed)
11/19/2014  8:31 AM  PATIENT:  Joylene Grapesimothy E Klare  34 y.o. male  PRE-OPERATIVE DIAGNOSIS:  non-restorable teeth  POST-OPERATIVE DIAGNOSIS: NORESTORABLE TEETH 10, 15  PROCEDURE:  Procedure(s): EXAMINATION UNDER ANESTHESIA; DENTAL  EXTRACTIONS TEETH # 10, 15    SURGEON:  Surgeon(s): Ocie DoyneScott Cam Dauphin, DDS  ANESTHESIA:   local and general  EBL:  minimal  DRAINS: none   SPECIMEN:  No Specimen  COUNTS:  YES  PLAN OF CARE: Discharge to home after PACU  PATIENT DISPOSITION:  PACU - hemodynamically stable.   PROCEDURE DETAILS: Dictation #[id number]  Georgia LopesScott M. Margaruite Top, DMD 11/19/2014 8:31 AM

## 2014-11-19 NOTE — Anesthesia Procedure Notes (Signed)
Procedure Name: Intubation Date/Time: 11/19/2014 7:56 AM Performed by: Lanell MatarBAKER, Billie Trager M Pre-anesthesia Checklist: Patient identified, Timeout performed, Emergency Drugs available, Suction available and Patient being monitored Patient Re-evaluated:Patient Re-evaluated prior to inductionOxygen Delivery Method: Circle system utilized Preoxygenation: Pre-oxygenation with 100% oxygen Intubation Type: IV induction Ventilation: Mask ventilation without difficulty Laryngoscope Size: Miller and 2 Grade View: Grade I Tube type: Oral Tube size: 7.5 mm Number of attempts: 1 Airway Equipment and Method: Stylet Placement Confirmation: ETT inserted through vocal cords under direct vision,  breath sounds checked- equal and bilateral,  positive ETCO2 and CO2 detector Secured at: 23 cm Tube secured with: Tape Dental Injury: Teeth and Oropharynx as per pre-operative assessment

## 2014-11-20 ENCOUNTER — Encounter (HOSPITAL_COMMUNITY): Payer: Self-pay | Admitting: Oral Surgery

## 2014-11-20 LAB — HEMOGLOBIN A1C
Hgb A1c MFr Bld: 5.1 % (ref 4.8–5.6)
Mean Plasma Glucose: 100 mg/dL

## 2015-01-21 ENCOUNTER — Encounter: Payer: Medicare Other | Attending: Family Medicine | Admitting: *Deleted

## 2015-01-21 DIAGNOSIS — Z713 Dietary counseling and surveillance: Secondary | ICD-10-CM | POA: Diagnosis not present

## 2015-01-21 DIAGNOSIS — E119 Type 2 diabetes mellitus without complications: Secondary | ICD-10-CM | POA: Diagnosis present

## 2015-01-21 NOTE — Patient Instructions (Signed)
Plan:  Aim for 4 Carb Choices per meal (60 grams) +/- 1 either way  Aim for 0-2 Carbs per snack if hungry  Include protein in moderation with your meals and snacks Consider reading food labels for Total Carbohydrate of foods Continue with your activity level daily as tolerated Continue taking diabetes medication Glimiperide as directed by MD

## 2015-01-27 ENCOUNTER — Encounter: Payer: Self-pay | Admitting: *Deleted

## 2015-01-27 NOTE — Progress Notes (Signed)
Diabetes Self-Management Education  Visit Type: First/Initial  Appt. Start Time: 0930 Appt. End Time: 1100  01/27/2015  Mr. [name], identified by name and date of birth, is a 34 y.o. male with a diagnosis of Diabetes: Type 2.  Other people present during visit:  Patient, Parent, Other (comment) (Group home caregiver)   ASSESSMENT  There were no vitals taken for this visit. There is no weight on file to calculate BMI.  Initial Visit Information:  Are you currently following a meal plan?: No (He eats healthy choices at Group Home)   Are you taking your medications as prescribed?: Yes     How often do you need to have someone help you when you read instructions, pamphlets, or other written materials from your doctor or pharmacy?: 5 - Always What is the last grade level you completed in school?: NA Due to mental retardation  Psychosocial:     Patient Belief/Attitude about Diabetes: Other (comment) (Due to mental retardation, unable to ask) Self-care barriers: Low literacy, Debilitated state due to current medical condition Self-management support: Doctor's office, Family (Group home staff) Other persons present: Patient, Parent, Other (comment) (Group home caregiver) Patient Concerns: Nutrition/Meal planning Special Needs: Instruct caregiver Preferred Learning Style: No preference indicated Learning Readiness: Other (comment) (unable to learn)  Complications:   How often do you check your blood sugar?: 0 times/day (not testing) Have you had a dental exam in the past 12 months?: Yes  Diet Intake:  Breakfast: cereal OR oatmeal OR croissant sandwich with sausage and egg, water Snack (morning): no Lunch: lean meat sandwich, small bag of chips, fresh or fruit cup, granola bar, water Snack (afternoon): if not fruit, may have popcorn or oatmeal cake Dinner: meat, 2 vegetables, occaissionally bread Snack (evening): no Beverage(s): lots of water  Exercise:  Exercise:  Light (walking / raking leaves) (physically active every day) Light Exercise amount of time (min / week): 150  Individualized Plan for Diabetes Self-Management Training:   Learning Objective:  Patient will have a greater understanding of diabetes self-management.  Patient education plan per assessed needs and concerns is to attend individual sessions for     Education Topics Reviewed with Patient Today:  Definition of diabetes, type 1 and 2, and the diagnosis of diabetes Role of diet in the treatment of diabetes and the relationship between the three main macronutrients and blood glucose level, Carbohydrate counting Role of exercise on diabetes management, blood pressure control and cardiac health. Reviewed patients medication for diabetes, action, purpose, timing of dose and side effects. (Discussed potential for low BG with Glpizide XL and how to prevent as well as how to treat)              PATIENTS GOALS/Plan (Developed by the patient):  Nutrition: General guidelines for healthy choices and portions discussed Physical Activity: Exercise 5-7 days per week Medications: take my medication as prescribed  Plan:   Patient Instructions  Plan:  Aim for 4 Carb Choices per meal (60 grams) +/- 1 either way  Aim for 0-2 Carbs per snack if hungry  Include protein in moderation with your meals and snacks Consider reading food labels for Total Carbohydrate of foods Continue with your activity level daily as tolerated Continue taking diabetes medication Glimiperide as directed by MD      Expected Outcomes:  Other (comment) (Caregiver and mother expressed good verbal understanding of information taught)  Education material provided: Living Well with Diabetes, Meal plan card and Carbohydrate counting sheet  If  problems or questions, patient to contact team via:  Phone and Email  Future DSME appointment: PRN

## 2015-01-27 NOTE — Progress Notes (Deleted)
  Medical Nutrition Therapy:  Appt start time: 0930 end time:  1100.  Assessment:  Primary concerns today: nutrition for DM 2. Patient is mentally retarded and unable to speak for himself. He is here with his mother and the caregiver from the group home where he lives. They report he has a good appetite and is physically active throughout the day. They also report he has a fear of needles, so they do not check his BG at the group home. Nor is there any blood drawn at the MD visits so there is no information on his A1c recently. Evidently blood can be drawn when he visits the dentist and they state his last A1c was "normal".   Preferred Learning Style:   No preference indicated   Learning Readiness: unable to learn  MEDICATIONS: see list. Diabetes medication is Glipizide XL   DIETARY INTAKE:  Usual eating pattern includes 3 meals and 2-3 snacks per day.  Everyday foods include variety of all food groups.  Avoided foods include cheese.    24-hr recall:  B ( AM): ***  Snk ( AM): ***  L ( PM): *** Snk ( PM): *** D ( PM): *** Snk ( PM): *** Beverages: ***  Usual physical activity: ***  Estimated energy needs: *** calories *** g carbohydrates *** g protein *** g fat  Progress Towards Goal(s):  {Desc; Goals Progress:21388}.   Nutritional Diagnosis:  {CHL AMB NUTRITIONAL [phone]}    Intervention:  Nutrition ***.  Teaching Method Utilized: *** Visual Auditory Hands on  Handouts given during visit include:  ***  ***  Barriers to learning/adherence to lifestyle change: ***  Demonstrated degree of understanding via:  Teach Back   Monitoring/Evaluation:  Dietary intake, exercise, ***, and body weight {follow up:15908}.

## 2016-04-17 IMAGING — CR DG FACIAL BONES COMPLETE 3+V
5 series · 5 of 5 positions shown · non-contrast
Comparison: None.

CLINICAL DATA: Dental extractions with concern for potential
mandibular pathology

EXAM:
FACIAL BONES COMPLETE 3+V

[ap [person_name]]
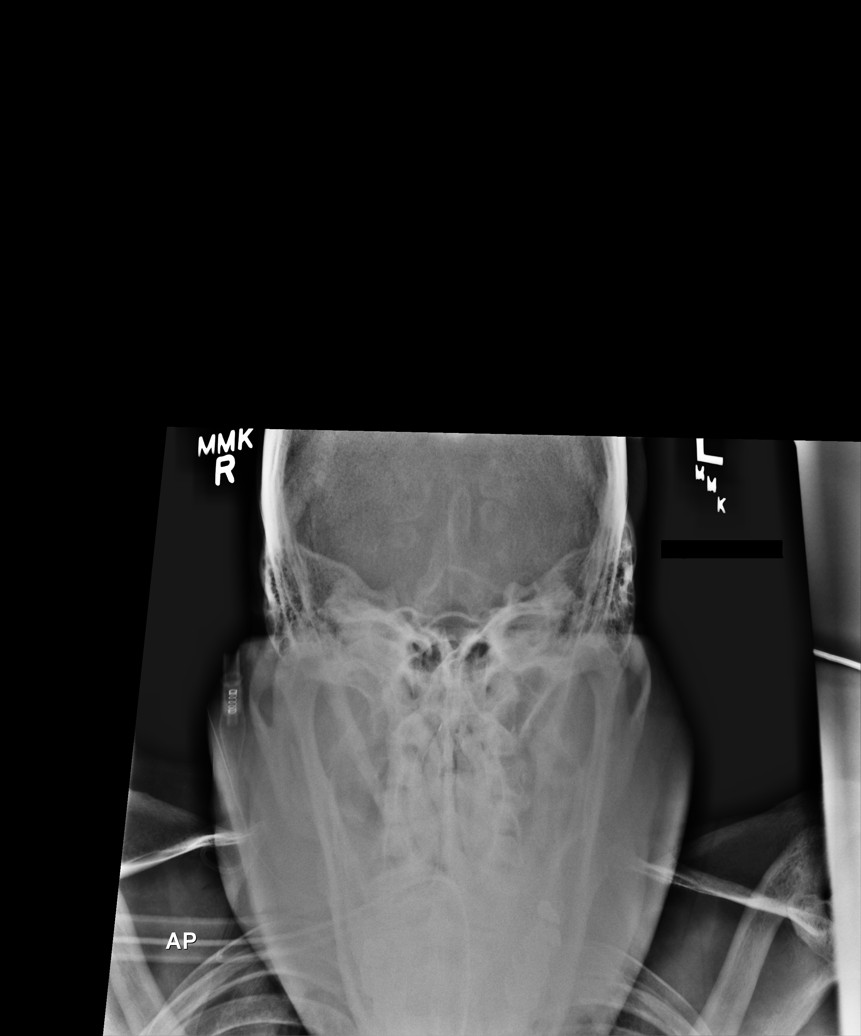

[PA]
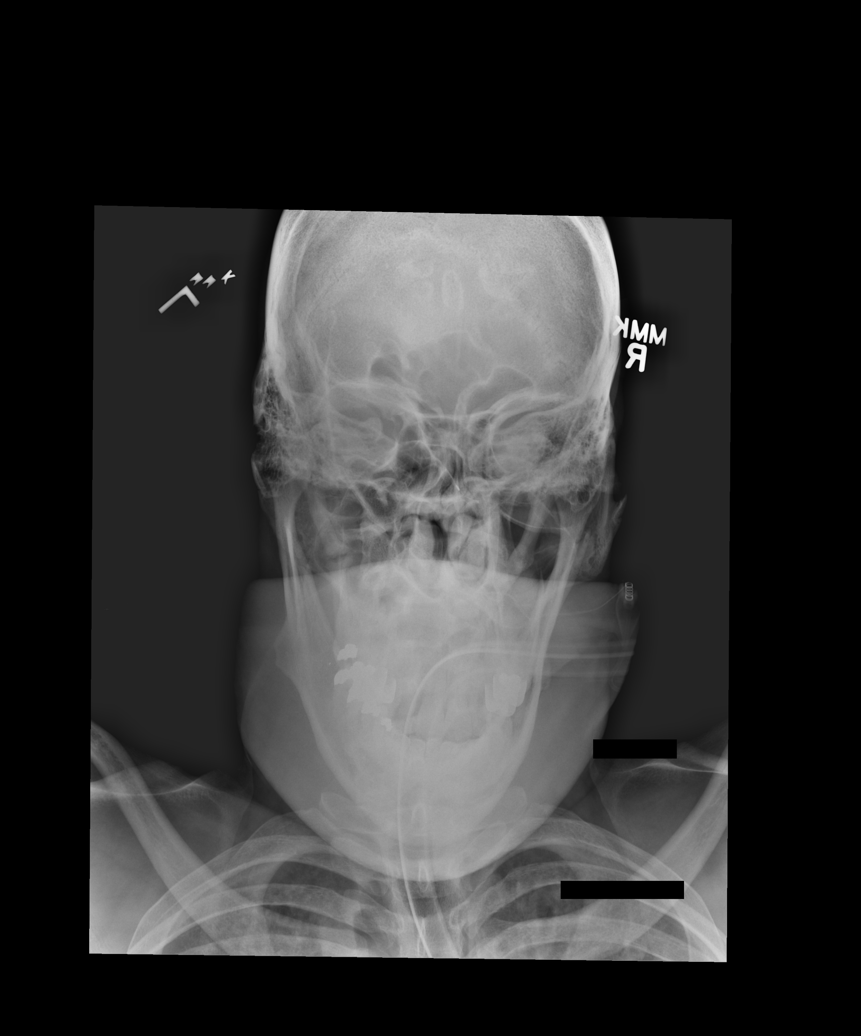

[axiolateral obl (1 of 3)]
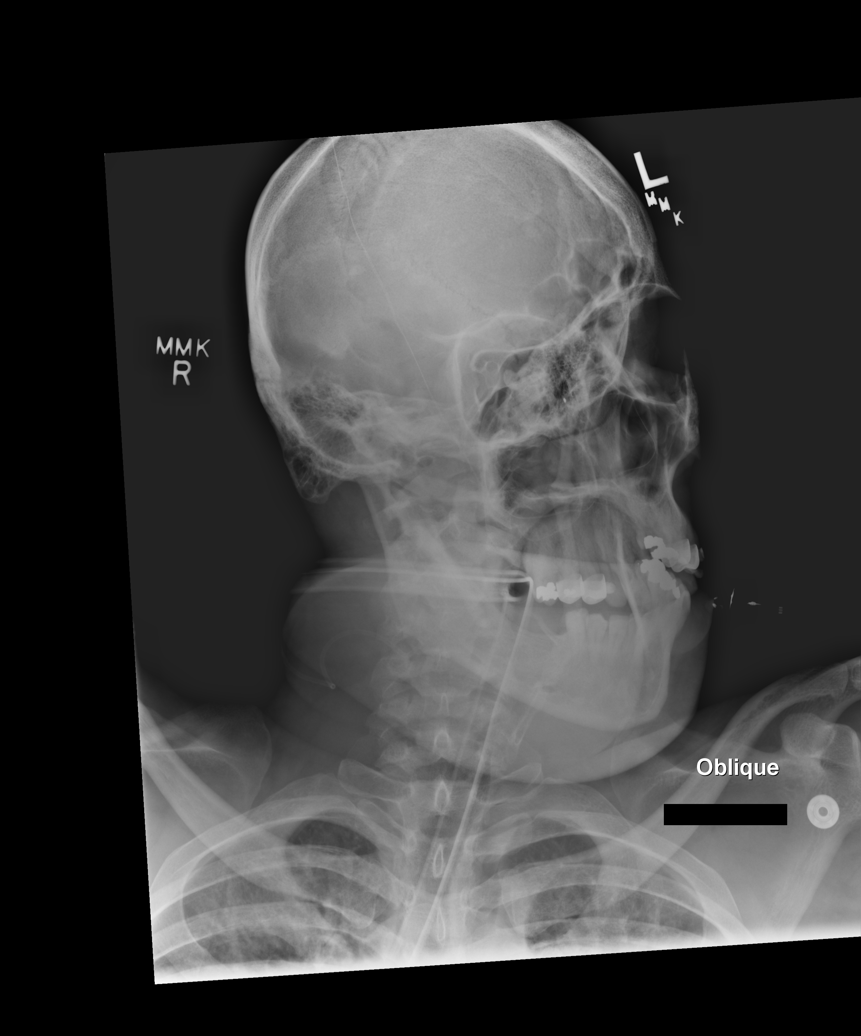

[axiolateral obl (2 of 3)]
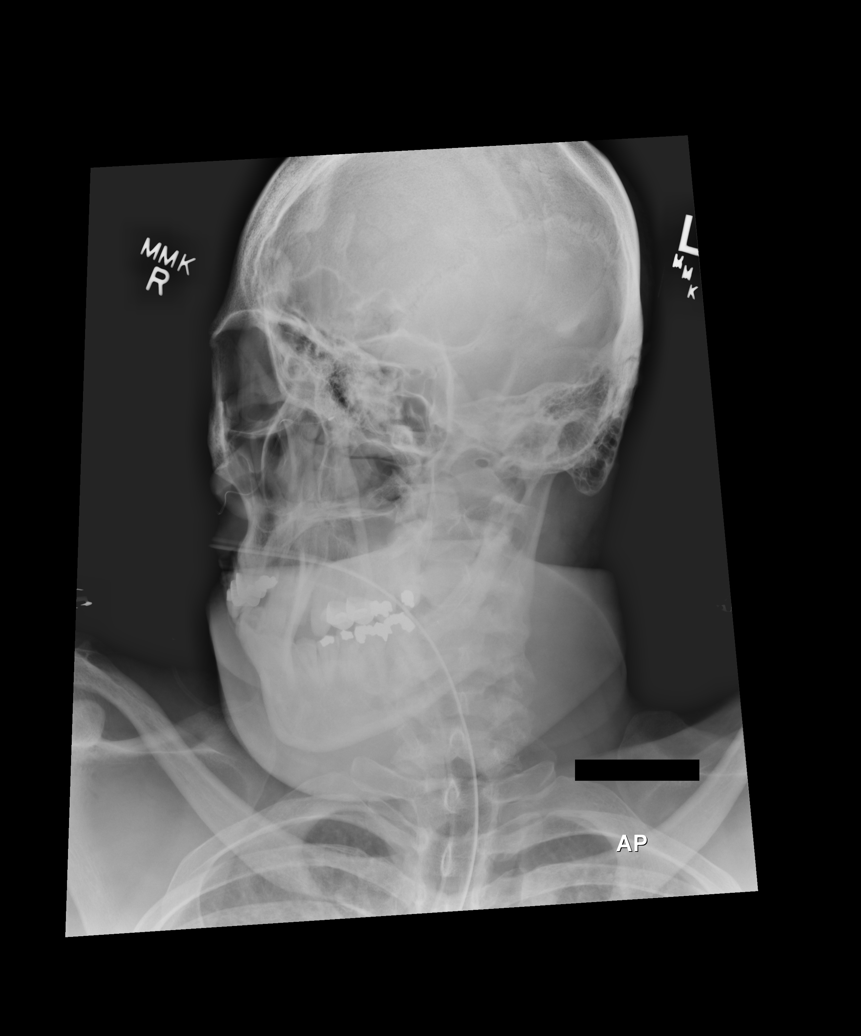

[axiolateral obl (3 of 3)]
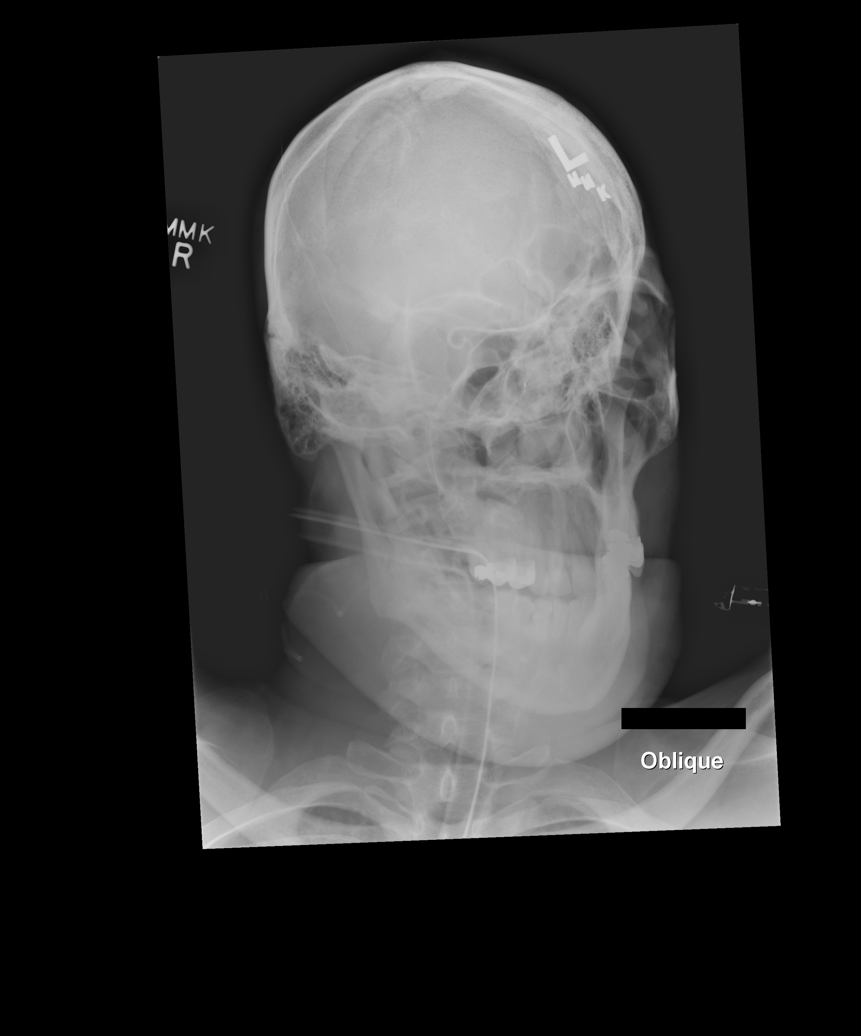

[5 of 5 positions shown; findings below may reference images not displayed]

FINDINGS: Right oblique, left oblique, Monish, and exaggerated Towne's views
obtained. No fracture or dislocation. No erosive change or bony
destruction. No dental abscess seen. There are multiple areas of
dental amalgam as well as dental implants.
IMPRESSION: No inflammatory change. No mass or bony destruction. No fracture or
dislocation.

## 2017-12-11 ENCOUNTER — Encounter (HOSPITAL_COMMUNITY): Payer: Self-pay | Admitting: Dentistry

## 2017-12-11 NOTE — H&P (Signed)
Gianluca Chhim&P and Dental exam form sent to [facility] for scan into chart. Dr. Allison Quarryobb will have copies in patient chart in OR.

## 2017-12-12 ENCOUNTER — Encounter (HOSPITAL_COMMUNITY): Payer: Self-pay | Admitting: *Deleted

## 2017-12-12 ENCOUNTER — Other Ambulatory Visit: Payer: Self-pay

## 2017-12-12 NOTE — Progress Notes (Signed)
Spoke with pt's mother, Mirian Modnita for pre-op call. She states pt is severely autistic. He will not allow anyone to touch him to do vital signs, take clothes off, etc. He will need to be sedated to have anything done. Mother states if pt sees a needle he will go ballistic. Pt is a type 2 diabetic. Not able to check blood sugar at all. She states the group home does check his urine for sugar. She will give the group home the instructions for pt.

## 2017-12-12 NOTE — Anesthesia Preprocedure Evaluation (Addendum)
Anesthesia Evaluation  Patient identified by MRN, date of birth, ID band Patient awake    Reviewed: Allergy & Precautions, H&P , NPO status , Patient's Chart, lab work & pertinent test results  Airway Mallampati: II  TM Distance: >3 FB Neck ROM: Full    Dental no notable dental hx. (+) Teeth Intact, Dental Advisory Given   Pulmonary neg pulmonary ROS,    Pulmonary exam normal breath sounds clear to auscultation       Cardiovascular Exercise Tolerance: Good negative cardio ROS   Rhythm:Regular Rate:Normal     Neuro/Psych Anxiety negative neurological ROS  negative psych ROS   GI/Hepatic negative GI ROS, Neg liver ROS,   Endo/Other  diabetes, Type 2, Oral Hypoglycemic AgentsMorbid obesity  Renal/GU negative Renal ROS  negative genitourinary   Musculoskeletal   Abdominal   Peds  Hematology negative hematology ROS (+)   Anesthesia Other Findings   Reproductive/Obstetrics negative OB ROS                            Anesthesia Physical Anesthesia Plan  ASA: III  Anesthesia Plan: General   Post-op Pain Management:    Induction: Intravenous  PONV Risk Score and Plan: 3 and Ondansetron, Dexamethasone and Midazolam  Airway Management Planned: Video Laryngoscope Planned and Nasal ETT  Additional Equipment:   Intra-op Plan:   Post-operative Plan: Extubation in OR  Informed Consent: I have reviewed the patients History and Physical, chart, labs and discussed the procedure including the risks, benefits and alternatives for the proposed anesthesia with the patient or authorized representative who has indicated his/her understanding and acceptance.   Dental advisory given  Plan Discussed with: CRNA  Anesthesia Plan Comments:         Anesthesia Quick Evaluation

## 2017-12-13 ENCOUNTER — Encounter (HOSPITAL_COMMUNITY): Payer: Self-pay | Admitting: General Practice

## 2017-12-13 ENCOUNTER — Encounter (HOSPITAL_COMMUNITY)
Admission: RE | Disposition: A | Discharge status: Home / Self Care | Payer: Self-pay | Source: Ambulatory Visit | Attending: Dentistry

## 2017-12-13 ENCOUNTER — Ambulatory Visit (HOSPITAL_COMMUNITY): Payer: Medicare Other | Admitting: Anesthesiology

## 2017-12-13 ENCOUNTER — Ambulatory Visit (HOSPITAL_COMMUNITY)
Admission: RE | Admit: 2017-12-13 | Discharge: 2017-12-13 | Disposition: A | Discharge status: Home / Self Care | Payer: Medicare Other | Source: Ambulatory Visit | Attending: Dentistry | Admitting: Dentistry

## 2017-12-13 DIAGNOSIS — E119 Type 2 diabetes mellitus without complications: Secondary | ICD-10-CM | POA: Diagnosis not present

## 2017-12-13 DIAGNOSIS — Z7984 Long term (current) use of oral hypoglycemic drugs: Secondary | ICD-10-CM | POA: Diagnosis not present

## 2017-12-13 DIAGNOSIS — K029 Dental caries, unspecified: Secondary | ICD-10-CM | POA: Insufficient documentation

## 2017-12-13 DIAGNOSIS — Z79899 Other long term (current) drug therapy: Secondary | ICD-10-CM | POA: Diagnosis not present

## 2017-12-13 DIAGNOSIS — F419 Anxiety disorder, unspecified: Secondary | ICD-10-CM | POA: Diagnosis not present

## 2017-12-13 HISTORY — PX: DENTAL RESTORATION/EXTRACTION WITH X-RAY: SHX5796

## 2017-12-13 HISTORY — DX: Aphasia: R47.01

## 2017-12-13 HISTORY — DX: Autistic disorder: F84.0

## 2017-12-13 LAB — COMPREHENSIVE METABOLIC PANEL
ALBUMIN: 4.1 g/dL (ref 3.5–5.0)
ALK PHOS: 61 U/L (ref 38–126)
ALT: 43 U/L (ref 17–63)
AST: 43 U/L — AB (ref 15–41)
Anion gap: 7 (ref 5–15)
BILIRUBIN TOTAL: 0.8 mg/dL (ref 0.3–1.2)
BUN: 14 mg/dL (ref 6–20)
CALCIUM: 8.9 mg/dL (ref 8.9–10.3)
CO2: 25 mmol/L (ref 22–32)
Chloride: 107 mmol/L (ref 101–111)
Creatinine, Ser: 1.07 mg/dL (ref 0.61–1.24)
GFR calc Af Amer: >60 mL/min (ref >60)
GFR calc non Af Amer: >60 mL/min (ref >60)
Glucose, Bld: 108 mg/dL — ABNORMAL HIGH (ref 65–99)
Potassium: 4.1 mmol/L (ref 3.5–5.1)
Sodium: 139 mmol/L (ref 135–145)
TOTAL PROTEIN: 7.1 g/dL (ref 6.5–8.1)

## 2017-12-13 LAB — HEMOGLOBIN A1C
Hgb A1c MFr Bld: 6 % — ABNORMAL HIGH (ref 4.8–5.6)
Mean Plasma Glucose: 125.5 mg/dL

## 2017-12-13 LAB — CBC
HEMATOCRIT: 47.5 % (ref 39.0–52.0)
HEMOGLOBIN: 16.6 g/dL (ref 13.0–17.0)
MCH: 27.4 pg (ref 26.0–34.0)
MCHC: 34.9 g/dL (ref 30.0–36.0)
MCV: 78.5 fL (ref 78.0–100.0)
Platelets: 247 10*3/uL (ref 150–400)
RBC: 6.05 MIL/uL — AB (ref 4.22–5.81)
RDW: 14.1 % (ref 11.5–15.5)
WBC: 7.3 10*3/uL (ref 4.0–10.5)

## 2017-12-13 LAB — LIPID PANEL
Cholesterol: 105 mg/dL (ref 0–200)
HDL: 34 mg/dL — ABNORMAL LOW (ref >40)
LDL CALC: 58 mg/dL (ref 0–99)
Total CHOL/HDL Ratio: 3.1 RATIO
Triglycerides: 66 mg/dL (ref <150)
VLDL: 13 mg/dL (ref 0–40)

## 2017-12-13 SURGERY — DENTAL RESTORATION/EXTRACTION WITH X-RAY
Anesthesia: General | Site: Mouth

## 2017-12-13 MED ORDER — SUGAMMADEX SODIUM 200 MG/2ML IV SOLN
INTRAVENOUS | Status: DC | PRN
  Administered 2017-12-13: 226 mg via INTRAVENOUS

## 2017-12-13 MED ORDER — KETAMINE HCL 100 MG/ML IJ SOLN
INTRAMUSCULAR | Status: DC | PRN
  Administered 2017-12-13: 350 mg via INTRAMUSCULAR

## 2017-12-13 MED ORDER — ROCURONIUM BROMIDE 10 MG/ML (PF) SYRINGE
PREFILLED_SYRINGE | INTRAVENOUS | Status: DC | PRN
  Administered 2017-12-13: 40 mg via INTRAVENOUS

## 2017-12-13 MED ORDER — LIDOCAINE 2% (20 MG/ML) 5 ML SYRINGE
INTRAMUSCULAR | Status: AC
  Filled 2017-12-13: qty 5

## 2017-12-13 MED ORDER — FENTANYL CITRATE (PF) 250 MCG/5ML IJ SOLN
INTRAMUSCULAR | Status: AC
  Filled 2017-12-13: qty 5

## 2017-12-13 MED ORDER — OXYMETAZOLINE HCL 0.05 % NA SOLN
NASAL | Status: AC
  Filled 2017-12-13: qty 15

## 2017-12-13 MED ORDER — DEXMEDETOMIDINE HCL 200 MCG/2ML IV SOLN
INTRAVENOUS | Status: DC | PRN
  Administered 2017-12-13 (×3): 10 ug via INTRAVENOUS

## 2017-12-13 MED ORDER — SUGAMMADEX SODIUM 200 MG/2ML IV SOLN
INTRAVENOUS | Status: AC
  Filled 2017-12-13: qty 2

## 2017-12-13 MED ORDER — 0.9 % SODIUM CHLORIDE (POUR BTL) OPTIME
TOPICAL | Status: DC | PRN
  Administered 2017-12-13: 1000 mL

## 2017-12-13 MED ORDER — ONDANSETRON HCL 4 MG/2ML IJ SOLN
INTRAMUSCULAR | Status: AC
  Filled 2017-12-13: qty 2

## 2017-12-13 MED ORDER — LACTATED RINGERS IV SOLN
INTRAVENOUS | Status: DC | PRN
  Administered 2017-12-13: 08:00:00 via INTRAVENOUS

## 2017-12-13 MED ORDER — BUPIVACAINE-EPINEPHRINE (PF) 0.5% -1:200000 IJ SOLN
INTRAMUSCULAR | Status: AC
  Filled 2017-12-13: qty 18

## 2017-12-13 MED ORDER — LIDOCAINE-EPINEPHRINE 2 %-1:100000 IJ SOLN
INTRAMUSCULAR | Status: AC
  Filled 2017-12-13: qty 1

## 2017-12-13 MED ORDER — MIDAZOLAM HCL 5 MG/5ML IJ SOLN
INTRAMUSCULAR | Status: DC | PRN
  Administered 2017-12-13: 6 mg via INTRAMUSCULAR

## 2017-12-13 MED ORDER — PROPOFOL 10 MG/ML IV BOLUS
INTRAVENOUS | Status: AC
  Filled 2017-12-13: qty 20

## 2017-12-13 MED ORDER — DEXAMETHASONE SODIUM PHOSPHATE 10 MG/ML IJ SOLN
INTRAMUSCULAR | Status: DC | PRN
  Administered 2017-12-13: 4 mg via INTRAVENOUS

## 2017-12-13 MED ORDER — KETAMINE HCL 100 MG/ML IJ SOLN
INTRAMUSCULAR | Status: AC
  Filled 2017-12-13: qty 1

## 2017-12-13 MED ORDER — LIDOCAINE-EPINEPHRINE 2 %-1:100000 IJ SOLN
INTRAMUSCULAR | Status: AC
  Filled 2017-12-13: qty 17

## 2017-12-13 MED ORDER — HYDROMORPHONE HCL 1 MG/ML IJ SOLN: 0.2500 mg | INTRAMUSCULAR | Status: DC | PRN

## 2017-12-13 MED ORDER — ONDANSETRON HCL 4 MG/2ML IJ SOLN
INTRAMUSCULAR | Status: DC | PRN
  Administered 2017-12-13: 4 mg via INTRAVENOUS

## 2017-12-13 MED ORDER — MIDAZOLAM HCL 10 MG/2ML IJ SOLN
INTRAMUSCULAR | Status: AC
  Filled 2017-12-13: qty 2

## 2017-12-13 MED ORDER — DEXAMETHASONE SODIUM PHOSPHATE 10 MG/ML IJ SOLN
INTRAMUSCULAR | Status: AC
  Filled 2017-12-13: qty 1

## 2017-12-13 MED ORDER — MIDAZOLAM HCL 2 MG/2ML IJ SOLN
INTRAMUSCULAR | Status: AC
  Filled 2017-12-13: qty 2

## 2017-12-13 SURGICAL SUPPLY — 40 items
BLADE 10 SAFETY STRL DISP (BLADE) ×3 IMPLANT
BLADE SURG 15 STRL LF DISP TIS (BLADE) IMPLANT
BLADE SURG 15 STRL SS (BLADE)
CANISTER SUCT 3000ML PPV (MISCELLANEOUS) ×3 IMPLANT
COVER BACK TABLE 60X90IN (DRAPES) ×3 IMPLANT
COVER SURGICAL LIGHT HANDLE (MISCELLANEOUS) ×3 IMPLANT
DECANTER SPIKE VIAL GLASS SM (MISCELLANEOUS) IMPLANT
DRAPE HALF SHEET 40X57 (DRAPES) ×3 IMPLANT
ELECT COATED BLADE 2.86 ST (ELECTRODE) IMPLANT
ELECT REM PT RETURN 9FT ADLT (ELECTROSURGICAL)
ELECTRODE REM PT RTRN 9FT ADLT (ELECTROSURGICAL) IMPLANT
GAUZE PACKING FOLDED 2  STR (GAUZE/BANDAGES/DRESSINGS) ×2
GAUZE PACKING FOLDED 2 STR (GAUZE/BANDAGES/DRESSINGS) ×1 IMPLANT
GAUZE SPONGE 2X2 8PLY STRL LF (GAUZE/BANDAGES/DRESSINGS) IMPLANT
GAUZE SPONGE 4X4 12PLY STRL (GAUZE/BANDAGES/DRESSINGS) IMPLANT
GAUZE SPONGE 4X4 16PLY XRAY LF (GAUZE/BANDAGES/DRESSINGS) ×3 IMPLANT
GLOVE BIO SURGEON STRL SZ 6 (GLOVE) ×3 IMPLANT
GLOVE BIO SURGEON STRL SZ7.5 (GLOVE) ×3 IMPLANT
GOWN STRL REUS W/ TWL LRG LVL3 (GOWN DISPOSABLE) IMPLANT
GOWN STRL REUS W/TWL LRG LVL3 (GOWN DISPOSABLE)
KIT BASIN OR (CUSTOM PROCEDURE TRAY) ×3 IMPLANT
KIT TURNOVER KIT B (KITS) ×3 IMPLANT
NDL FILTER BLUNT 18X1 1/2 (NEEDLE) IMPLANT
NDL PRECISIONGLIDE 27X1.5 (NEEDLE) IMPLANT
NEEDLE FILTER BLUNT 18X 1/2SAF (NEEDLE)
NEEDLE FILTER BLUNT 18X1 1/2 (NEEDLE) IMPLANT
NEEDLE PRECISIONGLIDE 27X1.5 (NEEDLE) IMPLANT
PAD ARMBOARD 7.5X6 YLW CONV (MISCELLANEOUS) ×6 IMPLANT
PENCIL BUTTON HOLSTER BLD 10FT (ELECTRODE) IMPLANT
SPONGE GAUZE 2X2 STER 10/PKG (GAUZE/BANDAGES/DRESSINGS)
SPONGE SURGIFOAM ABS GEL 12-7 (HEMOSTASIS) IMPLANT
SPONGE SURGIFOAM ABS GEL SZ50 (HEMOSTASIS) IMPLANT
SYR CONTROL 10ML LL (SYRINGE) IMPLANT
TOOTHBRUSH ADULT (PERSONAL CARE ITEMS) IMPLANT
TOWEL OR 17X24 6PK STRL BLUE (TOWEL DISPOSABLE) IMPLANT
TOWEL OR 17X26 10 PK STRL BLUE (TOWEL DISPOSABLE) ×3 IMPLANT
TUBE CONNECTING 12'X1/4 (SUCTIONS) ×1
TUBE CONNECTING 12X1/4 (SUCTIONS) ×2 IMPLANT
WATER STERILE IRR 1000ML POUR (IV SOLUTION) ×4 IMPLANT
YANKAUER SUCT BULB TIP NO VENT (SUCTIONS) ×3 IMPLANT

## 2017-12-13 NOTE — Anesthesia Procedure Notes (Addendum)
Procedure Name: Intubation Date/Time: 12/13/2017 7:44 AM Performed by: Demetrio LappingSmith, Lauriann Milillo P, CRNA Pre-anesthesia Checklist: Patient identified, Emergency Drugs available, Suction available and Patient being monitored Patient Re-evaluated:Patient Re-evaluated prior to induction Oxygen Delivery Method: Circle System Utilized Preoxygenation: Pre-oxygenation with 100% oxygen Induction Type: Combination inhalational/ intravenous induction Ventilation: Mask ventilation without difficulty and Nasal airway inserted- appropriate to patient size Laryngoscope Size: Glidescope and 4 (elective) Grade View: Grade I Nasal Tubes: Left Tube size: 7.5 mm Number of attempts: 1 Placement Confirmation: ETT inserted through vocal cords under direct vision,  positive ETCO2 and breath sounds checked- equal and bilateral Secured at: 29 cm Tube secured with: Tape Dental Injury: Teeth and Oropharynx as per pre-operative assessment

## 2017-12-13 NOTE — Anesthesia Postprocedure Evaluation (Signed)
Anesthesia Post Note  Patient: [name]  Procedure(s) Performed: DENTAL RESTORATION/EXTRACTION WITH X-RAY (N/A Mouth)     Patient location during evaluation: PACU Anesthesia Type: General Level of consciousness: awake and alert Pain management: pain level controlled Vital Signs Assessment: post-procedure vital signs reviewed and stable Respiratory status: spontaneous breathing, nonlabored ventilation and respiratory function stable Cardiovascular status: blood pressure returned to baseline and stable Postop Assessment: no apparent nausea or vomiting Anesthetic complications: no    Last Vitals:  Vitals:   12/13/17 0911 12/13/17 0915  BP: 99/62   Pulse: 95 98  Resp: 12 (!) 21  Temp: 36.7 C   SpO2: 95% 92%    Last Pain: There were no vitals filed for this visit.               Ferguson Gertner,W. EDMOND

## 2017-12-13 NOTE — Transfer of Care (Signed)
Immediate Anesthesia Transfer of Care Note  Patient: [name]  Procedure(s) Performed: DENTAL RESTORATION/EXTRACTION WITH X-RAY (N/A Mouth)  Patient Location: PACU  Anesthesia Type:General  Level of Consciousness: sedated  Airway & Oxygen Therapy: Patient Spontanous Breathing, Patient connected to face mask oxygen and OPA  Post-op Assessment: Report given to RN, Post -op Vital signs reviewed and stable and Patient moving all extremities X 4  Post vital signs: Reviewed and stable  Last Vitals:  Vitals Value Taken Time  BP 99/62 12/13/2017  9:11 AM  Temp    Pulse 94 12/13/2017  9:11 AM  Resp 10 12/13/2017  9:11 AM  SpO2 96 % 12/13/2017  9:11 AM  Vitals shown include unvalidated device data.  Last Pain: There were no vitals filed for this visit.       Complications: No apparent anesthesia complications

## 2017-12-13 NOTE — Op Note (Signed)
This is a radiology report the survey consisted of 7 films.  Trabeculation of the jaws is normal maxillary sinuses are not viewed teeth are of normal number for an adult caries is  not noted.  Paranasal structures are normal.  Color changes are noted with tooth #14.  Impression broken tooth.  Recommendation extraction.  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  Following steps from anesthesia had narrowing hose were stabilized and 7 dental x-rays were exposed.  The face was scrubbed with Betadine solution and a moist vaginal throat pack was placed.  7 dental x-rays were exposed.  The teeth were thoroughly cleansed with a prophylaxis paste and decay was charted.  The following procedures were performed. Tooth 22-facial resin Tooth 27-facial resin Tooth 14-flap was elevated to expose the base of tooth 14.  Periosteal elevator was used to elevate the roots of tooth 14.  The roots were elevated and removed.  Hemorrhage was controlled with pressure. The mouth was cleansed of all debris.  Throat pack was removed.  Patient was taken to recovery in good condition.

## 2017-12-13 NOTE — Discharge Instructions (Signed)

## 2017-12-13 NOTE — Brief Op Note (Signed)
12/13/2017  8:46 AM  PATIENT:  [name]  37 y.o. male  PRE-OPERATIVE DIAGNOSIS:  dental caries  POST-OPERATIVE DIAGNOSIS:  dental caries  PROCEDURE:  Procedure(s): DENTAL RESTORATION/EXTRACTION WITH X-RAY (N/A)  SURGEON:  Surgeon(s) and Role:    * Dashan Chizmar, DDS - Primary  PHYSICIAN ASSISTANT:   ASSISTANTS: none   ANESTHESIA:   general  EBL:    BLOOD ADMINISTERED:none  DRAINS: none   LOCAL MEDICATIONS USED:  NONE  SPECIMEN:  No Specimen  DISPOSITION OF SPECIMEN:  N/A  COUNTS:  YES  TOURNIQUET:  * No tourniquets in log *  DICTATION: .Dragon Dictation  PLAN OF CARE: Discharge to home after PACU  PATIENT DISPOSITION:  PACU - hemodynamically stable.   Delay start of Pharmacological VTE agent (>24hrs) due to surgical blood loss or risk of bleeding: yes

## 2017-12-14 ENCOUNTER — Encounter (HOSPITAL_COMMUNITY): Payer: Self-pay | Admitting: Dentistry
